# Patient Record
Sex: Male | Born: 1970 | Race: White | Hispanic: No | Marital: Married | Smoking: Current every day smoker
Health system: Southern US, Community
[De-identification: ages and names within clinical notes are randomized; demographics above are authoritative.]

## PROBLEM LIST (undated history)

## (undated) DIAGNOSIS — F17200 Nicotine dependence, unspecified, uncomplicated: Secondary | ICD-10-CM

## (undated) DIAGNOSIS — F1021 Alcohol dependence, in remission: Secondary | ICD-10-CM

## (undated) DIAGNOSIS — E785 Hyperlipidemia, unspecified: Secondary | ICD-10-CM

## (undated) DIAGNOSIS — F32A Depression, unspecified: Secondary | ICD-10-CM

## (undated) DIAGNOSIS — E669 Obesity, unspecified: Secondary | ICD-10-CM

## (undated) DIAGNOSIS — E66811 Obesity, class 1: Secondary | ICD-10-CM

## (undated) DIAGNOSIS — I10 Essential (primary) hypertension: Secondary | ICD-10-CM

## (undated) DIAGNOSIS — F419 Anxiety disorder, unspecified: Secondary | ICD-10-CM

## (undated) DIAGNOSIS — F329 Major depressive disorder, single episode, unspecified: Secondary | ICD-10-CM

## (undated) HISTORY — PX: APPENDECTOMY: SHX54

## (undated) HISTORY — PX: BACK SURGERY: SHX140

## (undated) HISTORY — PX: ELBOW SURGERY: SHX618

---

## 2008-10-04 ENCOUNTER — Emergency Department: Payer: Self-pay | Admitting: Emergency Medicine

## 2010-10-18 ENCOUNTER — Inpatient Hospital Stay: Payer: Self-pay | Admitting: Psychiatry

## 2012-01-25 ENCOUNTER — Ambulatory Visit: Payer: Self-pay | Admitting: Unknown Physician Specialty

## 2012-01-27 LAB — PATHOLOGY REPORT

## 2014-08-14 NOTE — Op Note (Signed)
PATIENT NAME:  Tony Gibson, Tony Gibson MR#:  [id number] DATE OF BIRTH:  [birth year]  DATE OF PROCEDURE:  01/25/2012  PREOPERATIVE DIAGNOSES:  1. Chronic lateral epicondylitis, left elbow.  2. Ulnar nerve entrapment left elbow.   POSTOPERATIVE DIAGNOSES:  1. Chronic lateral epicondylitis, left elbow.  2. Ulnar nerve entrapment left elbow.   PROCEDURES PERFORMED: Nirschl procedure of left elbow along with anterior transposition of the left ulnar nerve.   SURGEON: Kathrene Alu., M.D.   ANESTHESIA: General.   HISTORY: The patient had a long history of lateral epicondylitis to his left elbow. It has been refractory to conservative treatment which included injection of the lateral epicondyle with steroid and anesthetic. He also had ulnar nerve entrapment at the elbow that was confirmed with nerve conduction studies.   The patient was ultimately brought in for surgery due to the chronicity of his condition.   DESCRIPTION OF PROCEDURE: The patient was taken to the Operating Room where satisfactory general anesthesia was achieved. A tourniquet was applied to his left upper arm. The left upper extremity was prepped and draped in the usual fashion for a procedure about the elbow. The left upper extremity was exsanguinated and the tourniquet was inflated. About a 1-1/2 inch incision was made over the patient's lateral epicondyle. Dissection was carried down through the subcutaneous tissue onto the extensor mechanism. The interval between the extensor carpi radialis and the common extensor tendon was divided. Dissection was carried down to the lateral epicondyle and the radial head. Fibrous scar tissue was appreciated and was resected in a wedge-shape fashion. I did open the radial humeral joint. There was some fluid in the joint but no articular surface deformity was appreciated.   I went ahead and used a rongeur to debride some osteophytes from the lateral epicondyle. I then drilled multiple small  holes in the lateral condyle.   The split that I made in the extensor mechanism was then closed side-to-side with #0 Vicryl sutures. Next, I went ahead and approached the medial side of the elbow. A slightly curved incision was made just anterior to the medial epicondyle and dissection was bluntly and sharply carried down to the medial epicondyle itself. I then freed up the ulnar nerve from its olecranon groove. Care was taken to protect the muscular branches of the ulnar nerve. I used a nerve stimulator to identify these branches.   The nerve was mobilized so that it could be transposed anteriorly to the soft tissue over the medial epicondyle. I held the nerve in this position with a suture placed through the subcutaneous tissue of the skin and then into the medial epicondyle just posterior to the anteriorly transposed nerve. I used two sutures to create the new tunnel for the ulnar nerve. The ulnar nerve seemed to move freely in the tunnel.   At this time, the tourniquet was released. Bleeding was controlled with digital pressure and coagulation cautery. The wound was irrigated with GU irrigant. The subcutaneous was closed with 2-0 Vicryl and the skin with skin staples. I then irrigated the lateral incision with GU irrigant and closed the subcutaneous with 2-0 Vicryl and the skin with skin staples. I infiltrated the medial wound with about 10 mL of 0.5% Marcaine without epinephrine and the lateral wound was infiltrated with about 5 mL of 0.5% Marcaine without epinephrine. Betadine was applied to the wounds followed by a sterile dressing. A posterior fiberglass splint was applied with the elbow flexed about 90 degrees. The wrist was included.  The patient was then awakened and transferred to a stretcher bed. He was taken to the recovery room in satisfactory condition. The tourniquet incidentally was released at the conclusion of the procedure. It was up 68 minutes. Blood loss was negligible.   ____________________________ Kathrene Alu., MD hbk:slb D: 01/25/2012 13:25:51 ET    T: 01/25/2012 13:46:15 ET        JOB#: [id number] cc: Kathrene Alu., MD, <Dictator> Vilinda Flake, Brooke Bonito MD ELECTRONICALLY SIGNED 03/03/2012 11:32

## 2015-10-15 ENCOUNTER — Ambulatory Visit: Payer: Self-pay | Admitting: Urology

## 2017-02-15 ENCOUNTER — Emergency Department
Admission: EM | Admit: 2017-02-15 | Discharge: 2017-02-15 | Disposition: A | Discharge status: Home / Self Care | Payer: 59 | Attending: Emergency Medicine | Admitting: Emergency Medicine

## 2017-02-15 ENCOUNTER — Emergency Department: Payer: 59

## 2017-02-15 ENCOUNTER — Encounter: Payer: Self-pay | Admitting: Emergency Medicine

## 2017-02-15 DIAGNOSIS — R079 Chest pain, unspecified: Secondary | ICD-10-CM

## 2017-02-15 DIAGNOSIS — F17210 Nicotine dependence, cigarettes, uncomplicated: Secondary | ICD-10-CM | POA: Diagnosis not present

## 2017-02-15 DIAGNOSIS — I10 Essential (primary) hypertension: Secondary | ICD-10-CM | POA: Diagnosis not present

## 2017-02-15 DIAGNOSIS — R0789 Other chest pain: Secondary | ICD-10-CM | POA: Insufficient documentation

## 2017-02-15 HISTORY — DX: Essential (primary) hypertension: I10

## 2017-02-15 LAB — CBC
HEMATOCRIT: 45.5 % (ref 40.0–52.0)
HEMOGLOBIN: 15.5 g/dL (ref 13.0–18.0)
MCH: 31.1 pg (ref 26.0–34.0)
MCHC: 34 g/dL (ref 32.0–36.0)
MCV: 91.4 fL (ref 80.0–100.0)
PLATELETS: 261 10*3/uL (ref 150–440)
RBC: 4.98 MIL/uL (ref 4.40–5.90)
RDW: 12.9 % (ref 11.5–14.5)
WBC: 7 10*3/uL (ref 3.8–10.6)

## 2017-02-15 LAB — BASIC METABOLIC PANEL WITH GFR
Anion gap: 7 (ref 5–15)
BUN: 14 mg/dL (ref 6–20)
CO2: 25 mmol/L (ref 22–32)
Calcium: 8.8 mg/dL — ABNORMAL LOW (ref 8.9–10.3)
Chloride: 101 mmol/L (ref 101–111)
Creatinine, Ser: 1.03 mg/dL (ref 0.61–1.24)
GFR calc Af Amer: >60 mL/min
GFR calc non Af Amer: >60 mL/min
Glucose, Bld: 185 mg/dL — ABNORMAL HIGH (ref 65–99)
Potassium: 3.5 mmol/L (ref 3.5–5.1)
Sodium: 133 mmol/L — ABNORMAL LOW (ref 135–145)

## 2017-02-15 LAB — LIPASE, BLOOD: Lipase: 37 U/L (ref 11–51)

## 2017-02-15 LAB — TROPONIN I: Troponin I: <0.03 ng/mL

## 2017-02-15 MED ORDER — GI COCKTAIL ~~LOC~~
30.0000 mL | Freq: Once | ORAL | Status: AC
  Administered 2017-02-15: 30 mL via ORAL
  Filled 2017-02-15: qty 30

## 2017-02-15 NOTE — ED Provider Notes (Signed)
[facility] Emergency Department Provider Note  Time seen: 8:20 AM  I have reviewed the triage vital signs and the nursing notes.   HISTORY  Chief Complaint Chest Pain    HPI Tony Gibson is a 46 y.o. male with a past medical history of hypertension presents to the emergency department for chest pain.  According to the patient for the past 24 hours he has been experiencing a sharp pain in the center of his chest with occasional pains in his left shoulder.  Denies any nausea, shortness of breath or diaphoresis.  States the pain has been constant over the past 24 hours, denies any associated symptoms.  Denies any worsening with deep inspiration, cough or movement.  Patient denies any personal or family cardiac history.  Denies any abdominal pain.   Past Medical History:  Diagnosis Date  . Hypertension     There are no active problems to display for this patient.   Past Surgical History:  Procedure Laterality Date  . APPENDECTOMY    . ELBOW SURGERY      Prior to Admission medications   Not on File    Allergies  Allergen Reactions  . Zithromax [Azithromycin] Swelling    No family history on file.  Social History Social History  Substance Use Topics  . Smoking status: Current Every Day Smoker  . Smokeless tobacco: Never Used  . Alcohol use Yes    Review of Systems  Constitutional: Negative for fever. Cardiovascular: Positive for chest pain over the past 24 hours.  Mild currently. Respiratory: Negative for shortness of breath. Gastrointestinal: Negative for abdominal pain Musculoskeletal: Negative for back pain. Neurological: Negative for headache All other ROS negative  ____________________________________________   PHYSICAL EXAM:  VITAL SIGNS: ED Triage Vitals  Enc Vitals Group     BP 02/15/17 0711 (!) 173/80     Pulse Rate 02/15/17 0711 97     Resp 02/15/17 0711 18     Temp 02/15/17 0711 97.7 F (36.5 C)     Temp Source  02/15/17 0711 Oral     SpO2 02/15/17 0711 96 %     Weight 02/15/17 0714 263 lb (119.3 kg)     Height 02/15/17 0714 6\' 1"  (1.854 m)     Head Circumference --      Peak Flow --      Pain Score 02/15/17 0713 3     Pain Loc --      Pain Edu? --      Excl. in [place]? --     Constitutional: Alert and oriented. Well appearing and in no distress. Eyes: Normal exam ENT   Head: Normocephalic and atraumatic.   Mouth/Throat: Mucous membranes are moist. Cardiovascular: Normal rate, regular rhythm. No murmur.  No chest wall tenderness to palpation.   Respiratory: Normal respiratory effort without tachypnea nor retractions. Breath sounds are clear Gastrointestinal: Soft, mild epigastric tenderness to palpation, no rebound or guarding.  No distention. Musculoskeletal: Nontender with normal range of motion in all extremities. No lower extremity tenderness or edema. Neurologic:  Normal speech and language. No gross focal neurologic deficits Skin:  Skin is warm, dry and intact.  Psychiatric: Mood and affect are normal.  ____________________________________________    EKG  EKG reviewed and interpreted by myself shows normal sinus rhythm at 100 bpm, narrow QRS, normal axis, normal intervals, no concerning ST changes.  Overall normal EKG.  ____________________________________________    RADIOLOGY  Chest x-ray normal  ____________________________________________   INITIAL IMPRESSION /  ASSESSMENT AND PLAN / ED COURSE  Pertinent labs & imaging results that were available during my care of the patient were reviewed by me and considered in my medical decision making (see chart for details).  Patient presents to the emergency department for central chest pain with some pain in his left shoulder.  Describes the pain as sharp, mild currently.  Differential would include ACS, chest wall pain, muscular skeletal pain, pleurisy.  Patient's labs including troponin are normal/negative.  X-ray is normal.   EKG is reassuring.  Patient does admit to daily alcohol intake, on exam he has mild to moderate epigastric tenderness to palpation.  We will treat with a GI cocktail and add on a lipase.  Patient agreeable to plan.  Labs are normal.  Troponin negative.  Lipase is normal.  Patient states pain is improved after GI cocktail states only very minimal discomfort at this time.  As the pain has been ongoing greater than 24 hours the troponin is negative, and the patient had improvement with GI cocktail, I believe the patient is safe for discharge home.  I did discuss with the patient follow-up with cardiology for stress test.  ____________________________________________   FINAL CLINICAL IMPRESSION(S) / ED DIAGNOSES  Chest pain    Harvest Dark, MD 02/15/17 [phone]

## 2017-02-15 NOTE — Discharge Instructions (Signed)
You have been seen in the emergency department today for chest pain. Your workup has shown normal results. As we discussed please follow-up with your primary care physician in the next 1-2 days for recheck. Return to the emergency department for any further chest pain, trouble breathing, or any other symptom personally concerning to yourself.  Please call the number provided for cardiology to arrange a stress test as soon as possible.

## 2017-02-15 NOTE — ED Triage Notes (Signed)
Says sharp chest pain for 24  Hours andnow going down left arm.

## 2017-02-15 NOTE — ED Notes (Signed)
Epad not working at time of discharge; paper copy signed and placed in chart.   

## 2017-07-13 ENCOUNTER — Other Ambulatory Visit: Payer: Self-pay | Admitting: Family Medicine

## 2017-07-13 DIAGNOSIS — M5442 Lumbago with sciatica, left side: Principal | ICD-10-CM

## 2017-07-13 DIAGNOSIS — G8929 Other chronic pain: Secondary | ICD-10-CM

## 2017-07-21 ENCOUNTER — Ambulatory Visit: Payer: 59

## 2017-08-16 ENCOUNTER — Ambulatory Visit
Admission: RE | Admit: 2017-08-16 | Discharge: 2017-08-16 | Disposition: A | Discharge status: Home / Self Care | Payer: Managed Care, Other (non HMO) | Source: Ambulatory Visit | Attending: Internal Medicine | Admitting: Internal Medicine

## 2017-08-16 ENCOUNTER — Other Ambulatory Visit: Payer: Self-pay | Admitting: Internal Medicine

## 2017-08-16 ENCOUNTER — Ambulatory Visit
Admission: RE | Admit: 2017-08-16 | Discharge: 2017-08-16 | Disposition: A | Discharge status: Home / Self Care | Payer: Managed Care, Other (non HMO) | Source: Ambulatory Visit | Attending: Family Medicine | Admitting: Family Medicine

## 2017-08-16 DIAGNOSIS — M5127 Other intervertebral disc displacement, lumbosacral region: Secondary | ICD-10-CM | POA: Insufficient documentation

## 2017-08-16 DIAGNOSIS — M5126 Other intervertebral disc displacement, lumbar region: Secondary | ICD-10-CM | POA: Diagnosis not present

## 2017-08-16 DIAGNOSIS — Z5309 Procedure and treatment not carried out because of other contraindication: Secondary | ICD-10-CM

## 2017-08-16 DIAGNOSIS — M5442 Lumbago with sciatica, left side: Secondary | ICD-10-CM | POA: Diagnosis not present

## 2017-08-16 DIAGNOSIS — G8929 Other chronic pain: Secondary | ICD-10-CM | POA: Insufficient documentation

## 2017-08-16 DIAGNOSIS — M48061 Spinal stenosis, lumbar region without neurogenic claudication: Secondary | ICD-10-CM | POA: Insufficient documentation

## 2017-08-16 DIAGNOSIS — Z135 Encounter for screening for eye and ear disorders: Secondary | ICD-10-CM | POA: Insufficient documentation

## 2018-06-15 ENCOUNTER — Encounter: Payer: Self-pay | Admitting: Emergency Medicine

## 2018-06-15 ENCOUNTER — Emergency Department
Admission: EM | Admit: 2018-06-15 | Discharge: 2018-06-15 | Disposition: A | Discharge status: Home / Self Care | Payer: Managed Care, Other (non HMO) | Attending: Emergency Medicine | Admitting: Emergency Medicine

## 2018-06-15 ENCOUNTER — Emergency Department: Payer: Managed Care, Other (non HMO)

## 2018-06-15 DIAGNOSIS — F17210 Nicotine dependence, cigarettes, uncomplicated: Secondary | ICD-10-CM | POA: Insufficient documentation

## 2018-06-15 DIAGNOSIS — Z79899 Other long term (current) drug therapy: Secondary | ICD-10-CM | POA: Diagnosis not present

## 2018-06-15 DIAGNOSIS — Z7982 Long term (current) use of aspirin: Secondary | ICD-10-CM | POA: Insufficient documentation

## 2018-06-15 DIAGNOSIS — M5416 Radiculopathy, lumbar region: Secondary | ICD-10-CM | POA: Diagnosis present

## 2018-06-15 DIAGNOSIS — G8929 Other chronic pain: Secondary | ICD-10-CM | POA: Diagnosis not present

## 2018-06-15 DIAGNOSIS — I10 Essential (primary) hypertension: Secondary | ICD-10-CM | POA: Diagnosis not present

## 2018-06-15 MED ORDER — METHYLPREDNISOLONE SODIUM SUCC 125 MG IJ SOLR
125.0000 mg | Freq: Once | INTRAMUSCULAR | Status: AC
  Administered 2018-06-15: 125 mg via INTRAMUSCULAR
  Filled 2018-06-15: qty 2

## 2018-06-15 MED ORDER — LIDOCAINE 5 % EX PTCH: 1.0000 | MEDICATED_PATCH | Freq: Two times a day (BID) | CUTANEOUS | 0 refills | Status: AC

## 2018-06-15 MED ORDER — OXYCODONE-ACETAMINOPHEN 5-325 MG PO TABS
1.0000 | ORAL_TABLET | Freq: Once | ORAL | Status: AC
  Administered 2018-06-15: 1 via ORAL
  Filled 2018-06-15: qty 1

## 2018-06-15 MED ORDER — LIDOCAINE 5 % EX PTCH
1.0000 | MEDICATED_PATCH | CUTANEOUS | Status: DC
  Administered 2018-06-15: 1 via TRANSDERMAL
  Filled 2018-06-15: qty 1

## 2018-06-15 MED ORDER — OXYCODONE-ACETAMINOPHEN 7.5-325 MG PO TABS: 1.0000 | ORAL_TABLET | Freq: Four times a day (QID) | ORAL | 0 refills | Status: DC | PRN

## 2018-06-15 MED ORDER — HYDROMORPHONE HCL 1 MG/ML IJ SOLN
1.0000 mg | Freq: Once | INTRAMUSCULAR | Status: AC
  Administered 2018-06-15: 1 mg via INTRAMUSCULAR
  Filled 2018-06-15: qty 1

## 2018-06-15 NOTE — ED Triage Notes (Signed)
Pt reports hx of bulging disc in his back and Sunday he jumped out of the way of something and thinks he pulled something. Pt reports pain is more no left lower back and radiates down his left leg.

## 2018-06-15 NOTE — ED Provider Notes (Signed)
[facility] Emergency Department Provider Note   ____________________________________________   First MD Initiated Contact with Patient 06/15/18 [phone]     (approximate)  I have reviewed the triage vital signs and the nursing notes.   HISTORY  Chief Complaint Back Pain    HPI Tony Gibson is a 48 y.o. male patient presents with a history of bulging disks.  Patient state he was at work 4 days ago and jumped all the way of a foreign object.  Patient states since that incident he has had increasing radicular pain to the left lower extremity.  Patient became concerned today secondary to incontinence.  Patient state pain increased with standing and ambulation.  Patient rates pain as 10/10.  Patient described pain is "sharp".  Patient denies numbness.  Past Medical History:  Diagnosis Date  . Hypertension     There are no active problems to display for this patient.   Past Surgical History:  Procedure Laterality Date  . APPENDECTOMY    . ELBOW SURGERY      Prior to Admission medications   Medication Sig Start Date End Date Taking? Authorizing Provider  albuterol (VENTOLIN HFA) 108 (90 Base) MCG/ACT inhaler Inhale 2 puffs into the lungs every 4 (four) hours as needed for wheezing. 05/21/16   [provider]  aspirin EC 81 MG tablet Take 81 mg by mouth daily.    [provider]  buPROPion (WELLBUTRIN SR) 150 MG 12 hr tablet Take 150 mg by mouth 2 (two) times daily. 12/03/16   [provider]  hydrochlorothiazide (HYDRODIURIL) 25 MG tablet Take 25 mg by mouth daily. 12/11/16   [provider]  lidocaine (LIDODERM) 5 % Place 1 patch onto the skin every 12 (twelve) hours. Remove & Discard patch within 12 hours or as directed by MD 06/15/18 06/15/19  Sable Feil, PA-C  losartan (COZAAR) 100 MG tablet Take 100 mg by mouth daily. 12/29/16   [provider]  lovastatin (MEVACOR) 10 MG tablet Take 10 mg by mouth at  bedtime. 01/08/17   [provider]  oxyCODONE-acetaminophen (PERCOCET) 7.5-325 MG tablet Take 1 tablet by mouth every 6 (six) hours as needed. 06/15/18   Sable Feil, PA-C  PARoxetine (PAXIL) 40 MG tablet Take 20 mg by mouth at bedtime. 12/29/16   [provider]    Allergies Zithromax [azithromycin]  No family history on file.  Social History Social History   Tobacco Use  . Smoking status: Current Every Day Smoker  . Smokeless tobacco: Never Used  Substance Use Topics  . Alcohol use: Yes  . Drug use: Not on file    Review of Systems Constitutional: No fever/chills Eyes: No visual changes. ENT: No sore throat. Cardiovascular: Denies chest pain. Respiratory: Denies shortness of breath. Gastrointestinal: No abdominal pain.  No nausea, no vomiting.  No diarrhea.  No constipation. Genitourinary: One episode of incontinence this morning. Musculoskeletal: Chronic back pain. Skin: Negative for rash. Neurological: Negative for headaches, focal weakness or numbness. Endocrine:  Hypertension. Allergic/Immunilogical: Zithromax. ____________________________________________   PHYSICAL EXAM:  VITAL SIGNS: ED Triage Vitals  Enc Vitals Group     BP 06/15/18 0917 (!) 153/95     Pulse Rate 06/15/18 0917 (!) 116     Resp 06/15/18 0917 16     Temp 06/15/18 0917 98.3 F (36.8 C)     Temp Source 06/15/18 0917 Oral     SpO2 06/15/18 0917 96 %     Weight 06/15/18 0915 270  lb (122.5 kg)     Height 06/15/18 0915 6\' 1"  (1.854 m)     Head Circumference --      Peak Flow --      Pain Score 06/15/18 0915 10     Pain Loc --      Pain Edu? --      Excl. in [place]? --     Constitutional: Alert and oriented.  Moderate distress.   Hematological/Lymphatic/Immunilogical: No cervical lymphadenopathy. Cardiovascular: Normal rate, regular rhythm. Grossly normal heart sounds.  Good peripheral circulation.  Elevated blood pressure. Respiratory: Normal respiratory effort.  No  retractions. Lungs CTAB. Musculoskeletal: No obvious deformity.  Patient states this time her reliance on upper extremity.  Patient is moderate guarding palpation of L3-S1.  In the supine position patient has positive straight leg test.   Neurologic:  Normal speech and language. No gross focal neurologic deficits are appreciated. No gait instability. Skin:  Skin is warm, dry and intact. No rash noted. Psychiatric: Mood and affect are normal. Speech and behavior are normal.  ____________________________________________   LABS (all labs ordered are listed, but only abnormal results are displayed)  Labs Reviewed - No data to display ____________________________________________  EKG   ____________________________________________  RADIOLOGY  ED MD interpretation:    Official radiology report(s): Mr Lumbar Spine Wo Contrast  Result Date: 06/15/2018 CLINICAL DATA:  Recent injury with worsening of left back pain and leg pain. EXAM: MRI LUMBAR SPINE WITHOUT CONTRAST TECHNIQUE: Multiplanar, multisequence MR imaging of the lumbar spine was performed. No intravenous contrast was administered. COMPARISON:  08/16/2017 FINDINGS: Segmentation: 5 lumbar type vertebral bodies as numbered previously. Alignment:  Normal Vertebrae:  No fracture or primary bone lesion. Conus medullaris and cauda equina: Conus extends to the L1-2 level. Conus and cauda equina appear normal. Paraspinal and other soft tissues: Negative Disc levels: No abnormality at T12-L1, L1-2 or L2-3. L3-4: Central disc herniation indents the thecal sac but does not cause neural compression. Similar appearance to the previous study. L4-5: Broad-based disc herniation more prominent towards the left. This compresses the thecal sac and results in lateral recess stenosis left more than right. This is similar to the previous study, perhaps minimally larger. 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 L5-S1: Disc degeneration with  loss of disc height. Endplate osteophytes and broad-based disc herniation more prominent towards the left. Stenosis of the subarticular lateral recesses left more than right. Bilateral foraminal narrowing. Potential for neural compression at this level, particularly in the left lateral recess. IMPRESSION: Very similar appearance to the study of April 2019. If there is any change, there may be very slight increased prominence of broad-based left posterolateral predominant disc herniation at L4-5. This is compressing the thecal sac and resulting in bilateral lateral recess stenosis that could compress either or both L5 nerves. The stenosis is worse on the left. L3-4: Central disc protrusion indents the thecal sac but does not cause visible neural compression. No change. L5-S1: Broad-based left posterolateral predominant osteophytes and disc herniation with stenosis of the subarticular lateral recesses left more than right that could compress in particular the left S1 nerve. Bilateral foraminal narrowing as well. No visible change at this level. Electronically Signed   By: Nelson Chimes M.D.   On: 06/15/2018 12:03    ____________________________________________   PROCEDURES  Procedure(s) performed: None  Procedures  Critical Care performed: No  ____________________________________________   INITIAL IMPRESSION / ASSESSMENT AND PLAN / ED COURSE  As part of my medical  decision making, I reviewed the following data within the electronic MEDICAL RECORD NUMBER     Radicular back pain secondary to disc protrusion.  Discussed MRI findings with patient.  Advised patient if you have any more episodes of incontinence return back to the ED.  Patient voided prior to departure.  Patient given discharge care instruction advised take medication as directed.  Patient advised follow-up with neurosurgery.      ____________________________________________   FINAL CLINICAL IMPRESSION(S) / ED DIAGNOSES  Final  diagnoses:  Chronic radicular low back pain     ED Discharge Orders         Ordered    lidocaine (LIDODERM) 5 %  Every 12 hours     06/15/18 1253    oxyCODONE-acetaminophen (PERCOCET) 7.5-325 MG tablet  Every 6 hours PRN     06/15/18 1253           Note:  This document was prepared using Dragon voice recognition software and may include unintentional dictation errors.    Sable Feil, PA-C 06/15/18 1256    Earleen Newport, MD 06/15/18 [phone]

## 2018-06-24 ENCOUNTER — Other Ambulatory Visit: Payer: Self-pay

## 2018-06-24 ENCOUNTER — Encounter
Admission: RE | Admit: 2018-06-24 | Discharge: 2018-06-24 | Disposition: A | Discharge status: Home / Self Care | Payer: Managed Care, Other (non HMO) | Source: Ambulatory Visit | Attending: Neurosurgery | Admitting: Neurosurgery

## 2018-06-24 ENCOUNTER — Ambulatory Visit
Admission: RE | Admit: 2018-06-24 | Discharge: 2018-06-24 | Disposition: A | Discharge status: Home / Self Care | Payer: Managed Care, Other (non HMO) | Source: Ambulatory Visit | Attending: Neurosurgery | Admitting: Neurosurgery

## 2018-06-24 DIAGNOSIS — Z01818 Encounter for other preprocedural examination: Secondary | ICD-10-CM | POA: Insufficient documentation

## 2018-06-24 HISTORY — DX: Anxiety disorder, unspecified: F41.9

## 2018-06-24 HISTORY — DX: Major depressive disorder, single episode, unspecified: F32.9

## 2018-06-24 HISTORY — DX: Depression, unspecified: F32.A

## 2018-06-24 HISTORY — DX: Hyperlipidemia, unspecified: E78.5

## 2018-06-24 LAB — URINALYSIS, ROUTINE W REFLEX MICROSCOPIC
Bilirubin Urine: NEGATIVE
Glucose, UA: NEGATIVE mg/dL
HGB URINE DIPSTICK: NEGATIVE
Ketones, ur: NEGATIVE mg/dL
Leukocytes,Ua: NEGATIVE
Nitrite: NEGATIVE
PH: 5 (ref 5.0–8.0)
Protein, ur: NEGATIVE mg/dL
Specific Gravity, Urine: 1.028 (ref 1.005–1.030)
Squamous Epithelial / HPF: NONE SEEN (ref 0–5)
WBC, UA: NONE SEEN WBC/hpf (ref 0–5)

## 2018-06-24 LAB — CBC
HEMATOCRIT: 48.3 % (ref 39.0–52.0)
Hemoglobin: 16.4 g/dL (ref 13.0–17.0)
MCH: 30.7 pg (ref 26.0–34.0)
MCHC: 34 g/dL (ref 30.0–36.0)
MCV: 90.4 fL (ref 80.0–100.0)
Platelets: 345 10*3/uL (ref 150–400)
RBC: 5.34 MIL/uL (ref 4.22–5.81)
RDW: 12.9 % (ref 11.5–15.5)
WBC: 15.4 10*3/uL — ABNORMAL HIGH (ref 4.0–10.5)
nRBC: 0 % (ref 0.0–0.2)

## 2018-06-24 LAB — PROTIME-INR
INR: 0.9 (ref 0.8–1.2)
Prothrombin Time: 12.1 seconds (ref 11.4–15.2)

## 2018-06-24 LAB — BASIC METABOLIC PANEL
Anion gap: 10 (ref 5–15)
BUN: 17 mg/dL (ref 6–20)
CHLORIDE: 97 mmol/L — AB (ref 98–111)
CO2: 28 mmol/L (ref 22–32)
Calcium: 9.3 mg/dL (ref 8.9–10.3)
Creatinine, Ser: 0.72 mg/dL (ref 0.61–1.24)
GFR calc Af Amer: >60 mL/min (ref >60)
GFR calc non Af Amer: >60 mL/min (ref >60)
Glucose, Bld: 122 mg/dL — ABNORMAL HIGH (ref 70–99)
Potassium: 3.8 mmol/L (ref 3.5–5.1)
Sodium: 135 mmol/L (ref 135–145)

## 2018-06-24 LAB — DIFFERENTIAL
Abs Immature Granulocytes: 0.12 10*3/uL — ABNORMAL HIGH (ref 0.00–0.07)
Basophils Absolute: 0.2 10*3/uL — ABNORMAL HIGH (ref 0.0–0.1)
Basophils Relative: 1 %
Eosinophils Absolute: 0.1 10*3/uL (ref 0.0–0.5)
Eosinophils Relative: 1 %
Immature Granulocytes: 1 %
LYMPHS ABS: 3.6 10*3/uL (ref 0.7–4.0)
LYMPHS PCT: 24 %
MONOS PCT: 10 %
Monocytes Absolute: 1.5 10*3/uL — ABNORMAL HIGH (ref 0.1–1.0)
Neutro Abs: 10 10*3/uL — ABNORMAL HIGH (ref 1.7–7.7)
Neutrophils Relative %: 63 %

## 2018-06-24 LAB — SURGICAL PCR SCREEN
MRSA, PCR: NEGATIVE
Staphylococcus aureus: NEGATIVE

## 2018-06-24 LAB — APTT: aPTT: 26 seconds (ref 24–36)

## 2018-06-24 NOTE — Patient Instructions (Signed)
Your procedure is scheduled on: Monday 06/27/18 at 6 am Report to [place]. [phone] if you have any concerns thath morning.  Remember: Instructions that are not followed completely may result in serious medical risk, up to and including death, or upon the discretion of your surgeon and anesthesiologist your surgery may need to be rescheduled.     _X__ 1. Do not eat food after midnight the night before your procedure.                 No gum chewing or hard candies. You may drink clear liquids up to 2 hours                 before you are scheduled to arrive for your surgery- DO not drink clear                 liquids within 2 hours of the start of your surgery.                 Clear Liquids include:  water, apple juice without pulp, clear carbohydrate                 drink such as Clearfast or Gatorade, Black Coffee or Tea (Do not add                 anything to coffee or tea).  __X__2.  On the morning of surgery brush your teeth with toothpaste and water, you                 may rinse your mouth with mouthwash if you wish.  Do not swallow any              toothpaste of mouthwash.     _X__ 3.  No Alcohol for 24 hours before or after surgery.   _X__ 4.  Do Not Smoke or use e-cigarettes For 24 Hours Prior to Your Surgery.                 Do not use any chewable tobacco products for at least 6 hours prior to                 surgery.  ____  5.  Bring all medications with you on the day of surgery if instructed.   __X__  6.  Notify your doctor if there is any change in your medical condition      (cold, fever, infections).     Do not wear jewelry, make-up, hairpins, clips or nail polish. Do not wear lotions, powders, or perfumes.  Do not shave 48 hours prior to surgery. Men may shave face and neck. Do not bring valuables to the hospital.    [facility] is not responsible for any belongings or valuables.  Contacts,  dentures/partials or body piercings may not be worn into surgery. Bring a case for your contacts, glasses or hearing aids, a denture cup will be supplied. Leave your suitcase in the car. After surgery it may be brought to your room. For patients admitted to the hospital, discharge time is determined by your treatment team.   Patients discharged the day of surgery will not be allowed to drive home.   Please read over the following fact sheets that you were given:   MRSA Information  __X__ Take these medicines the morning of surgery with A SIP OF WATER:    1. amLODipine (NORVASC)  2. busPIRone (  BUSPAR)  3.   4.  5.  6.  ____ Fleet Enema (as directed)   __X__ Use CHG Soap/SAGE wipes as directed  __X__ Use inhalers on the day of surgery  ____ Stop metformin/Janumet/Farxiga 2 days prior to surgery    ____ Take 1/2 of usual insulin dose the night before surgery. No insulin the morning          of surgery.   ____ Stop Blood Thinners Coumadin/Plavix/Xarelto/Pleta/Pradaxa/Eliquis/Effient/Aspirin  on   Or contact your Surgeon, Cardiologist or Medical Doctor regarding  ability to stop your blood thinners  __X__ Stop Anti-inflammatories 7 days before surgery such as Advil, Ibuprofen, Motrin,  BC or Goodies Powder, Naprosyn, Naproxen, Aleve, Aspirin    __X__ Stop all herbal supplements, fish oil or vitamin E until after surgery.    ____ Bring C-Pap to the hospital.

## 2018-06-27 ENCOUNTER — Other Ambulatory Visit: Payer: Self-pay

## 2018-06-27 ENCOUNTER — Ambulatory Visit: Payer: Managed Care, Other (non HMO) | Admitting: Certified Registered"

## 2018-06-27 ENCOUNTER — Ambulatory Visit: Payer: Managed Care, Other (non HMO)

## 2018-06-27 ENCOUNTER — Encounter
Admission: RE | Disposition: A | Discharge status: Home / Self Care | Payer: Self-pay | Source: Home / Self Care | Attending: Neurosurgery

## 2018-06-27 ENCOUNTER — Ambulatory Visit
Admission: RE | Admit: 2018-06-27 | Discharge: 2018-06-27 | Disposition: A | Discharge status: Home / Self Care | Payer: Managed Care, Other (non HMO) | Attending: Neurosurgery | Admitting: Neurosurgery

## 2018-06-27 ENCOUNTER — Encounter: Payer: Self-pay | Admitting: Neurosurgery

## 2018-06-27 DIAGNOSIS — Z881 Allergy status to other antibiotic agents status: Secondary | ICD-10-CM | POA: Diagnosis not present

## 2018-06-27 DIAGNOSIS — Z419 Encounter for procedure for purposes other than remedying health state, unspecified: Secondary | ICD-10-CM

## 2018-06-27 DIAGNOSIS — M48061 Spinal stenosis, lumbar region without neurogenic claudication: Secondary | ICD-10-CM | POA: Diagnosis not present

## 2018-06-27 DIAGNOSIS — F419 Anxiety disorder, unspecified: Secondary | ICD-10-CM | POA: Diagnosis not present

## 2018-06-27 DIAGNOSIS — Z79899 Other long term (current) drug therapy: Secondary | ICD-10-CM | POA: Insufficient documentation

## 2018-06-27 DIAGNOSIS — F17210 Nicotine dependence, cigarettes, uncomplicated: Secondary | ICD-10-CM | POA: Diagnosis not present

## 2018-06-27 DIAGNOSIS — I10 Essential (primary) hypertension: Secondary | ICD-10-CM | POA: Insufficient documentation

## 2018-06-27 DIAGNOSIS — Z7982 Long term (current) use of aspirin: Secondary | ICD-10-CM | POA: Diagnosis not present

## 2018-06-27 DIAGNOSIS — F329 Major depressive disorder, single episode, unspecified: Secondary | ICD-10-CM | POA: Insufficient documentation

## 2018-06-27 DIAGNOSIS — E669 Obesity, unspecified: Secondary | ICD-10-CM | POA: Diagnosis not present

## 2018-06-27 DIAGNOSIS — E785 Hyperlipidemia, unspecified: Secondary | ICD-10-CM | POA: Insufficient documentation

## 2018-06-27 DIAGNOSIS — M5116 Intervertebral disc disorders with radiculopathy, lumbar region: Secondary | ICD-10-CM | POA: Insufficient documentation

## 2018-06-27 HISTORY — PX: LUMBAR LAMINECTOMY/DECOMPRESSION MICRODISCECTOMY: SHX5026

## 2018-06-27 SURGERY — LUMBAR LAMINECTOMY/DECOMPRESSION MICRODISCECTOMY 2 LEVELS
Anesthesia: General | Laterality: Left

## 2018-06-27 MED ORDER — PROPOFOL 10 MG/ML IV BOLUS
INTRAVENOUS | Status: AC
  Filled 2018-06-27: qty 20

## 2018-06-27 MED ORDER — EPINEPHRINE PF 1 MG/ML IJ SOLN
INTRAMUSCULAR | Status: AC
  Filled 2018-06-27: qty 1

## 2018-06-27 MED ORDER — EPHEDRINE SULFATE 50 MG/ML IJ SOLN
INTRAMUSCULAR | Status: AC
  Filled 2018-06-27: qty 1

## 2018-06-27 MED ORDER — PHENYLEPHRINE HCL 10 MG/ML IJ SOLN
INTRAMUSCULAR | Status: AC
  Filled 2018-06-27: qty 1

## 2018-06-27 MED ORDER — LIDOCAINE HCL (PF) 2 % IJ SOLN
INTRAMUSCULAR | Status: AC
  Filled 2018-06-27: qty 10

## 2018-06-27 MED ORDER — FAMOTIDINE 20 MG PO TABS
ORAL_TABLET | ORAL | Status: AC
  Filled 2018-06-27: qty 1

## 2018-06-27 MED ORDER — ACETAMINOPHEN 10 MG/ML IV SOLN
INTRAVENOUS | Status: DC | PRN
  Administered 2018-06-27: 1000 mg via INTRAVENOUS

## 2018-06-27 MED ORDER — KETAMINE HCL 50 MG/ML IJ SOLN
INTRAMUSCULAR | Status: AC
  Filled 2018-06-27: qty 10

## 2018-06-27 MED ORDER — GLYCOPYRROLATE 0.2 MG/ML IJ SOLN
INTRAMUSCULAR | Status: DC | PRN
  Administered 2018-06-27: 0.2 mg via INTRAVENOUS

## 2018-06-27 MED ORDER — LIDOCAINE HCL (CARDIAC) PF 100 MG/5ML IV SOSY
PREFILLED_SYRINGE | INTRAVENOUS | Status: DC | PRN
  Administered 2018-06-27: 50 mg via INTRAVENOUS

## 2018-06-27 MED ORDER — DEXTROSE 5 % IV SOLN
3.0000 g | Freq: Once | INTRAVENOUS | Status: AC
  Administered 2018-06-27: 3 g via INTRAVENOUS
  Filled 2018-06-27: qty 3000

## 2018-06-27 MED ORDER — FENTANYL CITRATE (PF) 100 MCG/2ML IJ SOLN
INTRAMUSCULAR | Status: DC | PRN
  Administered 2018-06-27 (×4): 50 ug via INTRAVENOUS
  Administered 2018-06-27: 100 ug via INTRAVENOUS
  Administered 2018-06-27: 50 ug via INTRAVENOUS

## 2018-06-27 MED ORDER — METHYLPREDNISOLONE ACETATE 40 MG/ML IJ SUSP
INTRAMUSCULAR | Status: AC
  Filled 2018-06-27: qty 1

## 2018-06-27 MED ORDER — MIDAZOLAM HCL 2 MG/2ML IJ SOLN
INTRAMUSCULAR | Status: DC | PRN
  Administered 2018-06-27: 2 mg via INTRAVENOUS

## 2018-06-27 MED ORDER — METHOCARBAMOL 500 MG PO TABS: 500.0000 mg | ORAL_TABLET | Freq: Four times a day (QID) | ORAL | 0 refills | Status: DC | PRN

## 2018-06-27 MED ORDER — PROMETHAZINE HCL 25 MG/ML IJ SOLN: 6.2500 mg | INTRAMUSCULAR | Status: DC | PRN

## 2018-06-27 MED ORDER — OXYCODONE HCL 5 MG PO TABS: 5.0000 mg | ORAL_TABLET | Freq: Once | ORAL | Status: DC | PRN

## 2018-06-27 MED ORDER — DEXAMETHASONE SODIUM PHOSPHATE 10 MG/ML IJ SOLN
INTRAMUSCULAR | Status: AC
  Filled 2018-06-27: qty 1

## 2018-06-27 MED ORDER — DEXAMETHASONE SODIUM PHOSPHATE 10 MG/ML IJ SOLN
INTRAMUSCULAR | Status: DC | PRN
  Administered 2018-06-27: 10 mg via INTRAVENOUS

## 2018-06-27 MED ORDER — BACITRACIN 50000 UNITS IM SOLR
INTRAMUSCULAR | Status: AC
  Filled 2018-06-27: qty 1

## 2018-06-27 MED ORDER — GLYCOPYRROLATE 0.2 MG/ML IJ SOLN
INTRAMUSCULAR | Status: AC
  Filled 2018-06-27: qty 1

## 2018-06-27 MED ORDER — KETAMINE HCL 50 MG/ML IJ SOLN
INTRAMUSCULAR | Status: DC | PRN
  Administered 2018-06-27: 25 mg via INTRAMUSCULAR
  Administered 2018-06-27: 25 mg via INTRAVENOUS

## 2018-06-27 MED ORDER — MIDAZOLAM HCL 2 MG/2ML IJ SOLN
INTRAMUSCULAR | Status: AC
  Filled 2018-06-27: qty 2

## 2018-06-27 MED ORDER — SUCCINYLCHOLINE CHLORIDE 20 MG/ML IJ SOLN
INTRAMUSCULAR | Status: DC | PRN
  Administered 2018-06-27: 100 mg via INTRAVENOUS

## 2018-06-27 MED ORDER — ACETAMINOPHEN 10 MG/ML IV SOLN
INTRAVENOUS | Status: AC
  Filled 2018-06-27: qty 100

## 2018-06-27 MED ORDER — OXYCODONE HCL 5 MG PO TABS: 5.0000 mg | ORAL_TABLET | ORAL | 0 refills | Status: DC | PRN

## 2018-06-27 MED ORDER — SUCCINYLCHOLINE CHLORIDE 20 MG/ML IJ SOLN
INTRAMUSCULAR | Status: AC
  Filled 2018-06-27: qty 1

## 2018-06-27 MED ORDER — FENTANYL CITRATE (PF) 100 MCG/2ML IJ SOLN
INTRAMUSCULAR | Status: AC
  Filled 2018-06-27: qty 2

## 2018-06-27 MED ORDER — THROMBIN 5000 UNITS EX SOLR
CUTANEOUS | Status: DC | PRN
  Administered 2018-06-27: 5000 [IU] via TOPICAL

## 2018-06-27 MED ORDER — ROCURONIUM BROMIDE 50 MG/5ML IV SOLN
INTRAVENOUS | Status: AC
  Filled 2018-06-27: qty 1

## 2018-06-27 MED ORDER — PROPOFOL 10 MG/ML IV BOLUS
INTRAVENOUS | Status: DC | PRN
  Administered 2018-06-27: 200 mg via INTRAVENOUS

## 2018-06-27 MED ORDER — LACTATED RINGERS IV SOLN
INTRAVENOUS | Status: DC
  Administered 2018-06-27: 07:00:00 via INTRAVENOUS

## 2018-06-27 MED ORDER — METHYLPREDNISOLONE ACETATE 40 MG/ML IJ SUSP
INTRAMUSCULAR | Status: DC | PRN
  Administered 2018-06-27: 40 mg

## 2018-06-27 MED ORDER — BUPIVACAINE HCL (PF) 0.5 % IJ SOLN
INTRAMUSCULAR | Status: AC
  Filled 2018-06-27: qty 30

## 2018-06-27 MED ORDER — THROMBIN 5000 UNITS EX SOLR
CUTANEOUS | Status: AC
  Filled 2018-06-27: qty 5000

## 2018-06-27 MED ORDER — SODIUM CHLORIDE FLUSH 0.9 % IV SOLN
INTRAVENOUS | Status: AC
  Filled 2018-06-27: qty 20

## 2018-06-27 MED ORDER — SODIUM CHLORIDE 0.9 % IV SOLN
INTRAVENOUS | Status: DC | PRN
  Administered 2018-06-27: 1000 mL

## 2018-06-27 MED ORDER — ONDANSETRON HCL 4 MG/2ML IJ SOLN
INTRAMUSCULAR | Status: DC | PRN
  Administered 2018-06-27: 4 mg via INTRAVENOUS

## 2018-06-27 MED ORDER — ROCURONIUM BROMIDE 100 MG/10ML IV SOLN
INTRAVENOUS | Status: DC | PRN
  Administered 2018-06-27: 5 mg via INTRAVENOUS
  Administered 2018-06-27: 25 mg via INTRAVENOUS

## 2018-06-27 MED ORDER — DEXMEDETOMIDINE HCL IN NACL 200 MCG/50ML IV SOLN
INTRAVENOUS | Status: DC | PRN
  Administered 2018-06-27: 20 ug via INTRAVENOUS

## 2018-06-27 MED ORDER — FENTANYL CITRATE (PF) 100 MCG/2ML IJ SOLN: 25.0000 ug | INTRAMUSCULAR | Status: DC | PRN

## 2018-06-27 MED ORDER — BUPIVACAINE-EPINEPHRINE (PF) 0.5% -1:200000 IJ SOLN
INTRAMUSCULAR | Status: DC | PRN
  Administered 2018-06-27: 4 mL

## 2018-06-27 MED ORDER — MEPERIDINE HCL 50 MG/ML IJ SOLN: 6.2500 mg | INTRAMUSCULAR | Status: DC | PRN

## 2018-06-27 MED ORDER — GELATIN ABSORBABLE 12-7 MM EX MISC
CUTANEOUS | Status: AC
  Filled 2018-06-27: qty 1

## 2018-06-27 MED ORDER — FAMOTIDINE 20 MG PO TABS
20.0000 mg | ORAL_TABLET | Freq: Once | ORAL | Status: AC
  Administered 2018-06-27: 20 mg via ORAL

## 2018-06-27 MED ORDER — OXYCODONE HCL 5 MG/5ML PO SOLN: 5.0000 mg | Freq: Once | ORAL | Status: DC | PRN

## 2018-06-27 MED ORDER — ONDANSETRON HCL 4 MG/2ML IJ SOLN
INTRAMUSCULAR | Status: AC
  Filled 2018-06-27: qty 2

## 2018-06-27 SURGICAL SUPPLY — 62 items
BLADE BOVIE TIP EXT 4 (BLADE) ×3 IMPLANT
BUR NEURO DRILL SOFT 3.0X3.8M (BURR) ×3 IMPLANT
CANISTER SUCT 1200ML W/VALVE (MISCELLANEOUS) ×3 IMPLANT
CHLORAPREP W/TINT 26ML (MISCELLANEOUS) ×3 IMPLANT
CNTNR SPEC 2.5X3XGRAD LEK (MISCELLANEOUS) ×1
CONT SPEC 4OZ STER OR WHT (MISCELLANEOUS) ×2
CONTAINER SPEC 2.5X3XGRAD LEK (MISCELLANEOUS) ×1 IMPLANT
COUNTER NEEDLE 20/40 LG (NEEDLE) ×3 IMPLANT
COVER LIGHT HANDLE STERIS (MISCELLANEOUS) ×6 IMPLANT
COVER WAND RF STERILE (DRAPES) IMPLANT
DERMABOND ADVANCED (GAUZE/BANDAGES/DRESSINGS) ×2
DERMABOND ADVANCED .7 DNX12 (GAUZE/BANDAGES/DRESSINGS) ×1 IMPLANT
DRAPE C-ARM 42X70 (DRAPES) ×6 IMPLANT
DRAPE LAPAROTOMY 100X77 ABD (DRAPES) ×3 IMPLANT
DRAPE MICROSCOPE SPINE 48X150 (DRAPES) ×3 IMPLANT
DRAPE POUCH INSTRU U-SHP 10X18 (DRAPES) ×3 IMPLANT
DRAPE SURG 17X11 SM STRL (DRAPES) ×3 IMPLANT
DRSG TEGADERM 2-3/8X2-3/4 SM (GAUZE/BANDAGES/DRESSINGS) ×3 IMPLANT
DRSG TELFA 4X3 1S NADH ST (GAUZE/BANDAGES/DRESSINGS) ×3 IMPLANT
DURASEAL APPLICATOR TIP (TIP) IMPLANT
DURASEAL SPINE SEALANT 3ML (MISCELLANEOUS) IMPLANT
ELECT CAUTERY BLADE TIP 2.5 (TIP) ×3
ELECT EZSTD 165MM 6.5IN (MISCELLANEOUS) ×3
ELECT REM PT RETURN 9FT ADLT (ELECTROSURGICAL) ×3
ELECTRODE CAUTERY BLDE TIP 2.5 (TIP) ×1 IMPLANT
ELECTRODE EZSTD 165MM 6.5IN (MISCELLANEOUS) ×1 IMPLANT
ELECTRODE REM PT RTRN 9FT ADLT (ELECTROSURGICAL) ×1 IMPLANT
GAUZE SPONGE 4X4 12PLY STRL (GAUZE/BANDAGES/DRESSINGS) ×3 IMPLANT
GLOVE BIOGEL PI IND STRL 8 (GLOVE) ×1 IMPLANT
GLOVE BIOGEL PI INDICATOR 8 (GLOVE) ×2
GLOVE INDICATOR 8.0 STRL GRN (GLOVE) ×3 IMPLANT
GLOVE SURG SYN 7.0 (GLOVE) ×6 IMPLANT
GLOVE SURG SYN 8.0 (GLOVE) ×6 IMPLANT
GOWN STRL REUS W/ TWL LRG LVL3 (GOWN DISPOSABLE) ×1 IMPLANT
GOWN STRL REUS W/ TWL XL LVL3 (GOWN DISPOSABLE) ×1 IMPLANT
GOWN STRL REUS W/TWL LRG LVL3 (GOWN DISPOSABLE) ×2
GOWN STRL REUS W/TWL XL LVL3 (GOWN DISPOSABLE) ×2
GRADUATE 1200CC STRL 31836 (MISCELLANEOUS) ×3 IMPLANT
KIT TURNOVER KIT A (KITS) ×3 IMPLANT
KIT WILSON FRAME (KITS) ×3 IMPLANT
KNIFE BAYONET SHORT DISCETOMY (MISCELLANEOUS) ×6 IMPLANT
MARKER SKIN DUAL TIP RULER LAB (MISCELLANEOUS) ×6 IMPLANT
NDL SAFETY ECLIPSE 18X1.5 (NEEDLE) ×3 IMPLANT
NEEDLE HYPO 18GX1.5 SHARP (NEEDLE) ×6
NEEDLE HYPO 22GX1.5 SAFETY (NEEDLE) ×3 IMPLANT
NS IRRIG 1000ML POUR BTL (IV SOLUTION) ×3 IMPLANT
PACK LAMINECTOMY NEURO (CUSTOM PROCEDURE TRAY) ×3 IMPLANT
PAD ARMBOARD 7.5X6 YLW CONV (MISCELLANEOUS) ×3 IMPLANT
RETRACTOR TUBE METRX SPIN 22X7 (INSTRUMENTS) ×3 IMPLANT
SPOGE SURGIFLO 8M (HEMOSTASIS) ×2
SPONGE SURGIFLO 8M (HEMOSTASIS) ×1 IMPLANT
SUT NURALON 4 0 TR CR/8 (SUTURE) IMPLANT
SUT POLYSORB 2-0 5X18 GS-10 (SUTURE) ×6 IMPLANT
SUT VIC AB 0 CT1 18XCR BRD 8 (SUTURE) ×1 IMPLANT
SUT VIC AB 0 CT1 8-18 (SUTURE) ×2
SYR 20CC LL (SYRINGE) ×3 IMPLANT
SYR 3ML LL SCALE MARK (SYRINGE) ×3 IMPLANT
SYR TB 1ML 27GX1/2 LL (SYRINGE) ×3 IMPLANT
TOWEL OR 17X26 4PK STRL BLUE (TOWEL DISPOSABLE) ×9 IMPLANT
TRAY FOLEY MTR SLVR 16FR STAT (SET/KITS/TRAYS/PACK) IMPLANT
TUBING CONNECTING 10 (TUBING) ×2 IMPLANT
TUBING CONNECTING 10' (TUBING) ×1

## 2018-06-27 NOTE — Discharge Summary (Signed)
Procedure: Left L4-5 and left L5-S1 lumbar decompression Procedure date: 06/27/2018 Diagnosis: Lumbar radiculopathy  History: Tony Gibson is s/p L4-5 and L5-S1 lumbar decompression for lumbar radiculopathy.  POD0: Tolerated procedure well without complication.  Evaluated postoperatively still disoriented from anesthesia but able to answer questions and obey commands.  Left lower extremity pain has resolved.  Denies any other new pain/numbness/tingling in lower extremities, denies back pain.  Physical Exam: Vitals:   06/27/18 0627  BP: (!) 148/95  Pulse: 96  Resp: (!) 22  Temp: 99.8 F (37.7 C)  SpO2: 97%   Strength:5/5 throughout lower extremities Sensation: Intact and symmetric throughout upper and lower extremities Skin: Dressing clean and dry  Data:  Recent Labs  Lab 06/24/18 1425  NA 135  K 3.8  CL 97*  CO2 28  BUN 17  CREATININE 0.72  GLUCOSE 122*  CALCIUM 9.3   No results for input(s): AST, ALT, ALKPHOS in the last 168 hours.  Invalid input(s): TBILI   Recent Labs  Lab 06/24/18 1425  WBC 15.4*  HGB 16.4  HCT 48.3  PLT 345   Recent Labs  Lab 06/24/18 1425  APTT 26  INR 0.9         Other tests/results: No imaging reviewed  Assessment/Plan:  Tony Gibson is POD 0 status post L4-5 and L5-S1 lumbar decompression for lumbar radiculopathy.  Recovering well.  Symptoms that were present prior to surgery are resolved at this time.  We will continue pain control with oxycodone, Robaxin, Tylenol as needed.  He is scheduled to follow-up in clinic in approximately 2 weeks to monitor progress.  Marin Olp PA-C Department of Neurosurgery

## 2018-06-27 NOTE — Anesthesia Procedure Notes (Signed)
Procedure Name: Intubation °Performed by: Azhia Siefken, CRNA °Pre-anesthesia Checklist: Patient identified, Patient being monitored, Timeout performed, Emergency Drugs available and Suction available °Patient Re-evaluated:Patient Re-evaluated prior to induction °Oxygen Delivery Method: Circle system utilized °Preoxygenation: Pre-oxygenation with 100% oxygen °Induction Type: IV induction °Ventilation: Mask ventilation without difficulty °Laryngoscope Size: McGraph and 4 °Grade View: Grade I °Tube type: Oral °Tube size: 7.5 mm °Number of attempts: 1 °Airway Equipment and Method: Stylet and Video-laryngoscopy °Placement Confirmation: ETT inserted through vocal cords under direct vision,  positive ETCO2 and breath sounds checked- equal and bilateral °Secured at: 23 cm °Tube secured with: Tape °Dental Injury: Teeth and Oropharynx as per pre-operative assessment  ° ° ° ° ° ° °

## 2018-06-27 NOTE — Anesthesia Post-op Follow-up Note (Signed)
Anesthesia QCDR form completed.        

## 2018-06-27 NOTE — Discharge Instructions (Addendum)
°Your surgeon has performed an operation on your lumbar spine (low back) to relieve pressure on one or more nerves. Many times, patients feel better immediately after surgery and can “overdo it.” Even if you feel well, it is important that you follow these activity guidelines. If you do not let your back heal properly from the surgery, you can increase the chance of a disc herniation and/or return of your symptoms. The following are instructions to help in your recovery once you have been discharged from the hospital. ° °* Do not take anti-inflammatory medications for 3 days after surgery (naproxen [Aleve], ibuprofen [Advil, Motrin], celecoxib [Celebrex], etc.) ° °Activity  °  °No bending, lifting, or twisting (“BLT”). Avoid lifting objects heavier than 10 pounds (gallon milk jug).  Where possible, avoid household activities that involve lifting, bending, pushing, or pulling such as laundry, vacuuming, grocery shopping, and childcare. Try to arrange for help from friends and family for these activities while your back heals. ° °Increase physical activity slowly as tolerated.  Taking short walks is encouraged, but avoid strenuous exercise. Do not jog, run, bicycle, lift weights, or participate in any other exercises unless specifically allowed by your doctor. Avoid prolonged sitting, including car rides. ° °Talk to your doctor before resuming sexual activity. ° °You should not drive until cleared by your doctor. ° °Until released by your doctor, you should not return to work or school.  You should rest at home and let your body heal.  ° °You may shower two days after your surgery.  After showering, lightly dab your incision dry. Do not take a tub bath or go swimming for 3 weeks, or until approved by your doctor at your follow-up appointment. ° °If you smoke, we strongly recommend that you quit.  Smoking has been proven to interfere with normal healing in your back and will dramatically reduce the success rate of  your surgery. Please contact [facility] (800-QUIT-NOW) and use the resources at www.[facility].com for assistance in stopping smoking. ° °Surgical Incision °  °If you have a dressing on your incision, you may remove it three days after your surgery. Keep your incision area clean and dry. ° °If you have staples or stitches on your incision, you should have a follow up scheduled for removal. If you do not have staples or stitches, you will have steri-strips (small pieces of surgical tape) or Dermabond glue. The steri-strips/glue should begin to peel away within about a week (it is fine if the steri-strips fall off before then). If the strips are still in place one week after your surgery, you may gently remove them. ° °Diet          ° ° You may return to your usual diet. Be sure to stay hydrated. ° °When to Contact Us ° °Although your surgery and recovery will likely be uneventful, you may have some residual numbness, aches, and pains in your back and/or legs. This is normal and should improve in the next few weeks. ° °However, should you experience any of the following, contact us immediately: °• New numbness or weakness °• Pain that is progressively getting worse, and is not relieved by your pain medications or rest °• Bleeding, redness, swelling, pain, or drainage from surgical incision °• Chills or flu-like symptoms °• Fever greater than 101.0 F (38.3 C) °• Problems with bowel or bladder functions °• Difficulty breathing or shortness of breath °• Warmth, tenderness, or swelling in your calf ° °Contact Information °• During office hours (Monday-Friday   9 am to 5 pm), please call your physician at [phone] °• After hours and weekends, please call the [facility] at [phone] and ask for the Neurosurgery Resident On Call  °• For a life-threatening emergency, call 911 ° °AMBULATORY SURGERY  °DISCHARGE INSTRUCTIONS ° ° °1) The drugs that you were given will stay in your system until tomorrow so for the next 24  hours you should not: ° °A) Drive an automobile °B) Make any legal decisions °C) Drink any alcoholic beverage ° ° °2) You may resume regular meals tomorrow.  Today it is better to start with liquids and gradually work up to solid foods. ° °You may eat anything you prefer, but it is better to start with liquids, then soup and crackers, and gradually work up to solid foods. ° ° °3) Please notify your doctor immediately if you have any unusual bleeding, trouble breathing, redness and pain at the surgery site, drainage, fever, or pain not relieved by medication. ° ° ° °4) Additional Instructions: ° °Please contact your physician with any problems or Same Day Surgery at [phone], Monday through Friday 6 am to 4 pm, or [facility] number at [phone]. ° °

## 2018-06-27 NOTE — Anesthesia Preprocedure Evaluation (Signed)
Anesthesia Evaluation  Patient identified by MRN, date of birth, ID band Patient awake    Reviewed: Allergy & Precautions, NPO status , Patient's Chart, lab work & pertinent test results  History of Anesthesia Complications Negative for: history of anesthetic complications  Airway Mallampati: III  TM Distance: >3 FB Neck ROM: Full    Dental no notable dental hx.    Pulmonary neg sleep apnea, neg COPD, Current Smoker,    breath sounds clear to auscultation- rhonchi (-) wheezing      Cardiovascular Exercise Tolerance: Good hypertension, Pt. on medications (-) CAD, (-) Past MI, (-) Cardiac Stents and (-) CABG  Rhythm:Regular Rate:Normal - Systolic murmurs and - Diastolic murmurs    Neuro/Psych neg Seizures PSYCHIATRIC DISORDERS Anxiety Depression negative neurological ROS     GI/Hepatic negative GI ROS, Neg liver ROS,   Endo/Other  negative endocrine ROSneg diabetes  Renal/GU negative Renal ROS     Musculoskeletal negative musculoskeletal ROS (+)   Abdominal (+) + obese,   Peds  Hematology negative hematology ROS (+)   Anesthesia Other Findings Past Medical History: No date: Anxiety No date: Depression No date: HLD (hyperlipidemia) No date: Hypertension   Reproductive/Obstetrics                             Anesthesia Physical Anesthesia Plan  ASA: II  Anesthesia Plan: General   Post-op Pain Management:    Induction: Intravenous  PONV Risk Score and Plan: 0 and Ondansetron  Airway Management Planned: Oral ETT  Additional Equipment:   Intra-op Plan:   Post-operative Plan: Extubation in OR  Informed Consent: I have reviewed the patients History and Physical, chart, labs and discussed the procedure including the risks, benefits and alternatives for the proposed anesthesia with the patient or authorized representative who has indicated his/her understanding and acceptance.      Dental advisory given  Plan Discussed with: CRNA and Anesthesiologist  Anesthesia Plan Comments:         Anesthesia Quick Evaluation

## 2018-06-27 NOTE — Transfer of Care (Signed)
Immediate Anesthesia Transfer of Care Note  Patient: Tony Gibson  Procedure(s) Performed: LUMBAR HEMI LAMINECTOMY MICRODISCECTOMY 2 LEVELS LEFT L4/5, LEFT L5/S1 (Left )  Patient Location: PACU  Anesthesia Type:General  Level of Consciousness: sedated  Airway & Oxygen Therapy: Patient Spontanous Breathing and Patient connected to face mask oxygen  Post-op Assessment: Report given to RN and Post -op Vital signs reviewed and stable  Post vital signs: Reviewed  Last Vitals:  Vitals Value Taken Time  BP 132/91 06/27/2018 10:42 AM  Temp    Pulse 110 06/27/2018 10:42 AM  Resp 18 06/27/2018 10:42 AM  SpO2 96 % 06/27/2018 10:42 AM  Vitals shown include unvalidated device data.  Last Pain:  Vitals:   06/27/18 2542  TempSrc: Tympanic  PainSc: 10-Worst pain ever         Complications: No apparent anesthesia complications

## 2018-06-27 NOTE — Progress Notes (Signed)
Tony Olp  PA  In to check pt reflexes and strengh

## 2018-06-27 NOTE — H&P (Signed)
Tony Gibson is an 48 y.o. male.   Chief Complaint: left leg pain HPI: Tony Gibson returns today after having been seen by our clinic last year with bilateral leg pain. He underwent epidural steroid injections which did help. He says more recently he has been having episodic instances of pain which limit what he can do at work. Most recently, he is now dealing with severe left sided back pain traveling down the leg to the lateral part of the calf into the foot. He does have numbness in his toes. This pain is limiting what he can do. He is started back on gabapentin and got oxycodone prescribed. He has not noticed any relief of pain from this. He had an updated MRI of his lumbar spine. It shows stenosis at L4/5 and L5/S1. He is here for surgical decompression.    Past Medical History:  Diagnosis Date  . Anxiety   . Depression   . HLD (hyperlipidemia)   . Hypertension     Past Surgical History:  Procedure Laterality Date  . APPENDECTOMY    . ELBOW SURGERY      No family history on file. Social History:  reports that he has been smoking cigarettes. He has been smoking about 1.00 pack per day. He has never used smokeless tobacco. He reports current alcohol use. No history on file for drug.  Allergies:  Allergies  Allergen Reactions  . Enalapril Swelling    Throat, mouth, and feet swelling  . Zithromax [Azithromycin] Swelling    Facial swelling    Medications Prior to Admission  Medication Sig Dispense Refill  . albuterol (VENTOLIN HFA) 108 (90 Base) MCG/ACT inhaler Inhale 2 puffs into the lungs every 4 (four) hours as needed for wheezing.    Marland Kitchen amLODipine (NORVASC) 10 MG tablet Take 10 mg by mouth daily.    Marland Kitchen aspirin EC 81 MG tablet Take 81 mg by mouth daily.    . busPIRone (BUSPAR) 5 MG tablet Take 5 mg by mouth 2 (two) times daily.    Marland Kitchen gabapentin (NEURONTIN) 300 MG capsule Take 300 mg by mouth at bedtime.     . hydrochlorothiazide (HYDRODIURIL) 25 MG tablet Take 25 mg by  mouth daily.    Marland Kitchen losartan (COZAAR) 100 MG tablet Take 100 mg by mouth daily.    Marland Kitchen lovastatin (MEVACOR) 10 MG tablet Take 10 mg by mouth at bedtime.    Marland Kitchen PARoxetine (PAXIL) 40 MG tablet Take 40 mg by mouth at bedtime.     . lidocaine (LIDODERM) 5 % Place 1 patch onto the skin every 12 (twelve) hours. Remove & Discard patch within 12 hours or as directed by MD 10 patch 0  . oxyCODONE-acetaminophen (PERCOCET) 7.5-325 MG tablet Take 1 tablet by mouth every 6 (six) hours as needed. (Patient not taking: Reported on 06/22/2018) 20 tablet 0    No results found for this or any previous visit (from the past 48 hour(s)). No results found.  ROS  General ROS: Negative Psychological ROS: Negative Ophthalmic ROS: Negative ENT ROS: Negative Hematological and Lymphatic ROS: Negative  Endocrine ROS: Negative Respiratory ROS: Negative Cardiovascular ROS: Negative Gastrointestinal ROS: Negative Genito-Urinary ROS: Negative Musculoskeletal ROS: Negative Neurological ROS: Positive for left leg pain, numbness Dermatological ROS: Negative   Blood pressure (!) 148/95, pulse 96, temperature 99.8 F (37.7 C), temperature source Tympanic, resp. rate (!) 22, height 6\' 1"  (1.854 m), SpO2 97 %. Physical Exam  General appearance: Alert, cooperative, in no acute distress Head: Normocephalic, atraumatic  Eyes: Normal, EOM intact Oropharynx: Moist without lesions Back: No tenderness to palpation of the midline, some tenderness to palpation of the left paramedian area Ext: No edema in LE bilaterally CV: Regular rate Pulm: Clear to auscultation  Neurologic exam:  Mental status: alertness: alert, affect: normal Speech: fluent and clear Motor:strength symmetric 5/5, normal muscle mass and tone in bilateral lower extremities, severe pain elicited on all motions Sensory: Creased light touch over the lateral side of the calf and foot, otherwise intact Reflexes: 2+ and symmetric bilaterally for patella Gait:  Antalgic gait, using cane    MRI lumbar spine: There is a normal lordotic curvature. The alignment appears well-maintained. There is some degenerative disc disease noted at the lower lumbar regions. There is a large disc herniation L4-5 slightly eccentric to the left. This causes severe stenosis. There is a smaller disc herniation at L5-S1 on the left which results in mild stenosis.     Assessment/Plan -We will perform a left L4-5 and L5-S1 hemilaminectomy and discectomy.   Tony Perla, MD 06/27/2018, 6:35 AM

## 2018-06-27 NOTE — Interval H&P Note (Signed)
History and Physical Interval Note:  06/27/2018 6:37 AM  Tony Gibson  has presented today for surgery, with the diagnosis of LUMBAR RADICULOPATHY  The various methods of treatment have been discussed with the patient and family. After consideration of risks, benefits and other options for treatment, the patient has consented to  Procedure(s): LUMBAR HEMI LAMINECTOMY MICRODISCECTOMY 2 LEVELS LEFT L4/5, LEFT L5/S1 (Left) as a surgical intervention .  The patient's history has been reviewed, patient examined, no change in status, stable for surgery.  I have reviewed the patient's chart and labs.  Questions were answered to the patient's satisfaction.     Deetta Perla

## 2018-06-27 NOTE — Op Note (Signed)
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 Fredericksenpresented to clinic on2/25with ongoingleftleg pain.He had failed conservative management including PT, steroid courses, steroid injections, and prescription medications. MRI revealed left L4/5 and L5/S1stenosis compressingthetraversingnerve roots with a large disc herniation at L4/5.Therisks of surgery were explained to include hematoma, infection, damage to nerve roots, CSF leak, weakness, numbness, pain, need for future surgery including fusion, heart attack, and stroke.He elected to proceed with surgery for symptom relief.   Procedure The patient was brought to the OR after informed consent was obtained.He was given general anesthesia and intubated by the anesthesia service. Vascular access lines were placed.The patient was then placed prone on a Wilson frameensuring all pressure points were padded.Antibiotics were administered.A time-out was performed per protocol.   The patient was sterilely prepped and draped. Fluoroscopy confirmedL4/5interspaceandanincision was planned1.5cm off midlineon theleft.The incision was instilled withlocal anesthetic with epinephrine. The skin was opened sharply and the dissection taken to the fascia. This was incised and initial dilator placedthe spinous processes and lamina of L4on theleftout to the medial edge of the facet. Serial  dilatorswere inserted via fluoroscopy and the final62mm tube was placed at depth of7cm.  The microscope was brought into the field. The overlying muscle was removed from lamina and medial facet.Next, a matchstickdrill bit was used to remove theL4lamina centrallyand going laterally.The underlying ligament was freed and removed with combination of rongeurs. The decompression was taken caudal to the superior border ofL5and then the medial facet was identified. The dura was seen to be full and intact.Once all ligament and soft tissue was removed, attention was turned to inspection of the nerve root. The nerve root and thecal sacwas retractedand there was a large disc bulge seen. The disc space was coagulated and then entered sharply. There was egress of significant soft disc material which was removed. Next, curettes and blunt probes were used to remove all free disc material below the PLL. Once no disc was seen and disc space flat to level of surrounding endplates, the disc space was irrigated. The epidural space was palpated with blunt probe and found to have no compression. The L5 nerve root was seen to be free throughout the exposure. Hemostasis was achieved with cautery and floseal. The wound was irrigated.  Solumedrol was then placed along the traversing nerve root.  The dilators were removed and then the initial dilator placed onthe spinous process and lamina of L5on theleftout to the medial edge of the facet. Serial dilatorswere inserted via fluoroscopy and the final42mm tube was placed at depth of6cm.The overlying muscle was removed from lamina and medial facet.Next, a matchstickdrill bit was used to remove theL5lamina centrallyand going laterally.The underlying ligament was freed and removed with combination of rongeurs. The decompression was taken caudal to the superior border ofS1and then the medial facet was identified. The dura was seen to be full and intact.Once all  ligament and soft tissue was removed, attention was turned to inspection of the nerve root. The nerve root and thecal sacwas retractedand there was a no significant disc bulge seen. The lateral recess was decompressed until all ligament removed. The epidural space was palpated with blunt probe  and found to have no compression. The S1 nerve root was seen to be free throughout the exposure. Hemostasis was achieved with cautery and floseal. The wound was irrigated.  Solumedrol was then placed along the traversing nerve root. The dilators were removed. The microscope was removed.  Thefasciawas then closed using 0vicryl followed by thesubcutaneous and dermal layers with 2-0 vicryluntil the epidermis was well approximated. The skin was closed with Dermabond. A dressing was applied.  The patient was returned to supine position and extubated by the anesthesia service. The patient was then taken to the PACU for post-operative care wherehe was moving extremities symmetrically.   ESTIMATED BLOOD LOSS: 20cc  SPECIMENS None  IMPLANT None   I performed the case in its entiretywith assistance of PA, Corrie Mckusick, [place]

## 2018-06-27 NOTE — Anesthesia Postprocedure Evaluation (Signed)
Anesthesia Post Note  Patient: Tony Gibson  Procedure(s) Performed: LUMBAR HEMI LAMINECTOMY MICRODISCECTOMY 2 LEVELS LEFT L4/5, LEFT L5/S1 (Left )  Patient location during evaluation: PACU Anesthesia Type: General Level of consciousness: awake and alert and oriented Pain management: pain level controlled Vital Signs Assessment: post-procedure vital signs reviewed and stable Respiratory status: spontaneous breathing, nonlabored ventilation and respiratory function stable Cardiovascular status: blood pressure returned to baseline and stable Postop Assessment: no signs of nausea or vomiting Anesthetic complications: no     Last Vitals:  Vitals:   06/27/18 1132 06/27/18 1151  BP: 129/86 (!) 152/80  Pulse: 100 98  Resp: 13 19  Temp: 37.2 C 36.4 C  SpO2: 94% 95%    Last Pain:  Vitals:   06/27/18 1151  TempSrc: Oral  PainSc: 2                  [name]

## 2018-10-23 IMAGING — CR DG CHEST 2V
1 series · 2 of 2 positions shown · non-contrast
Comparison: None.

CLINICAL DATA: Chest pain for 1 day

EXAM:
CHEST  2 VIEW

[Series 1: dg chest 2 view · 0.14mm/px · 2 of 2 slices shown]
[im 1/2]
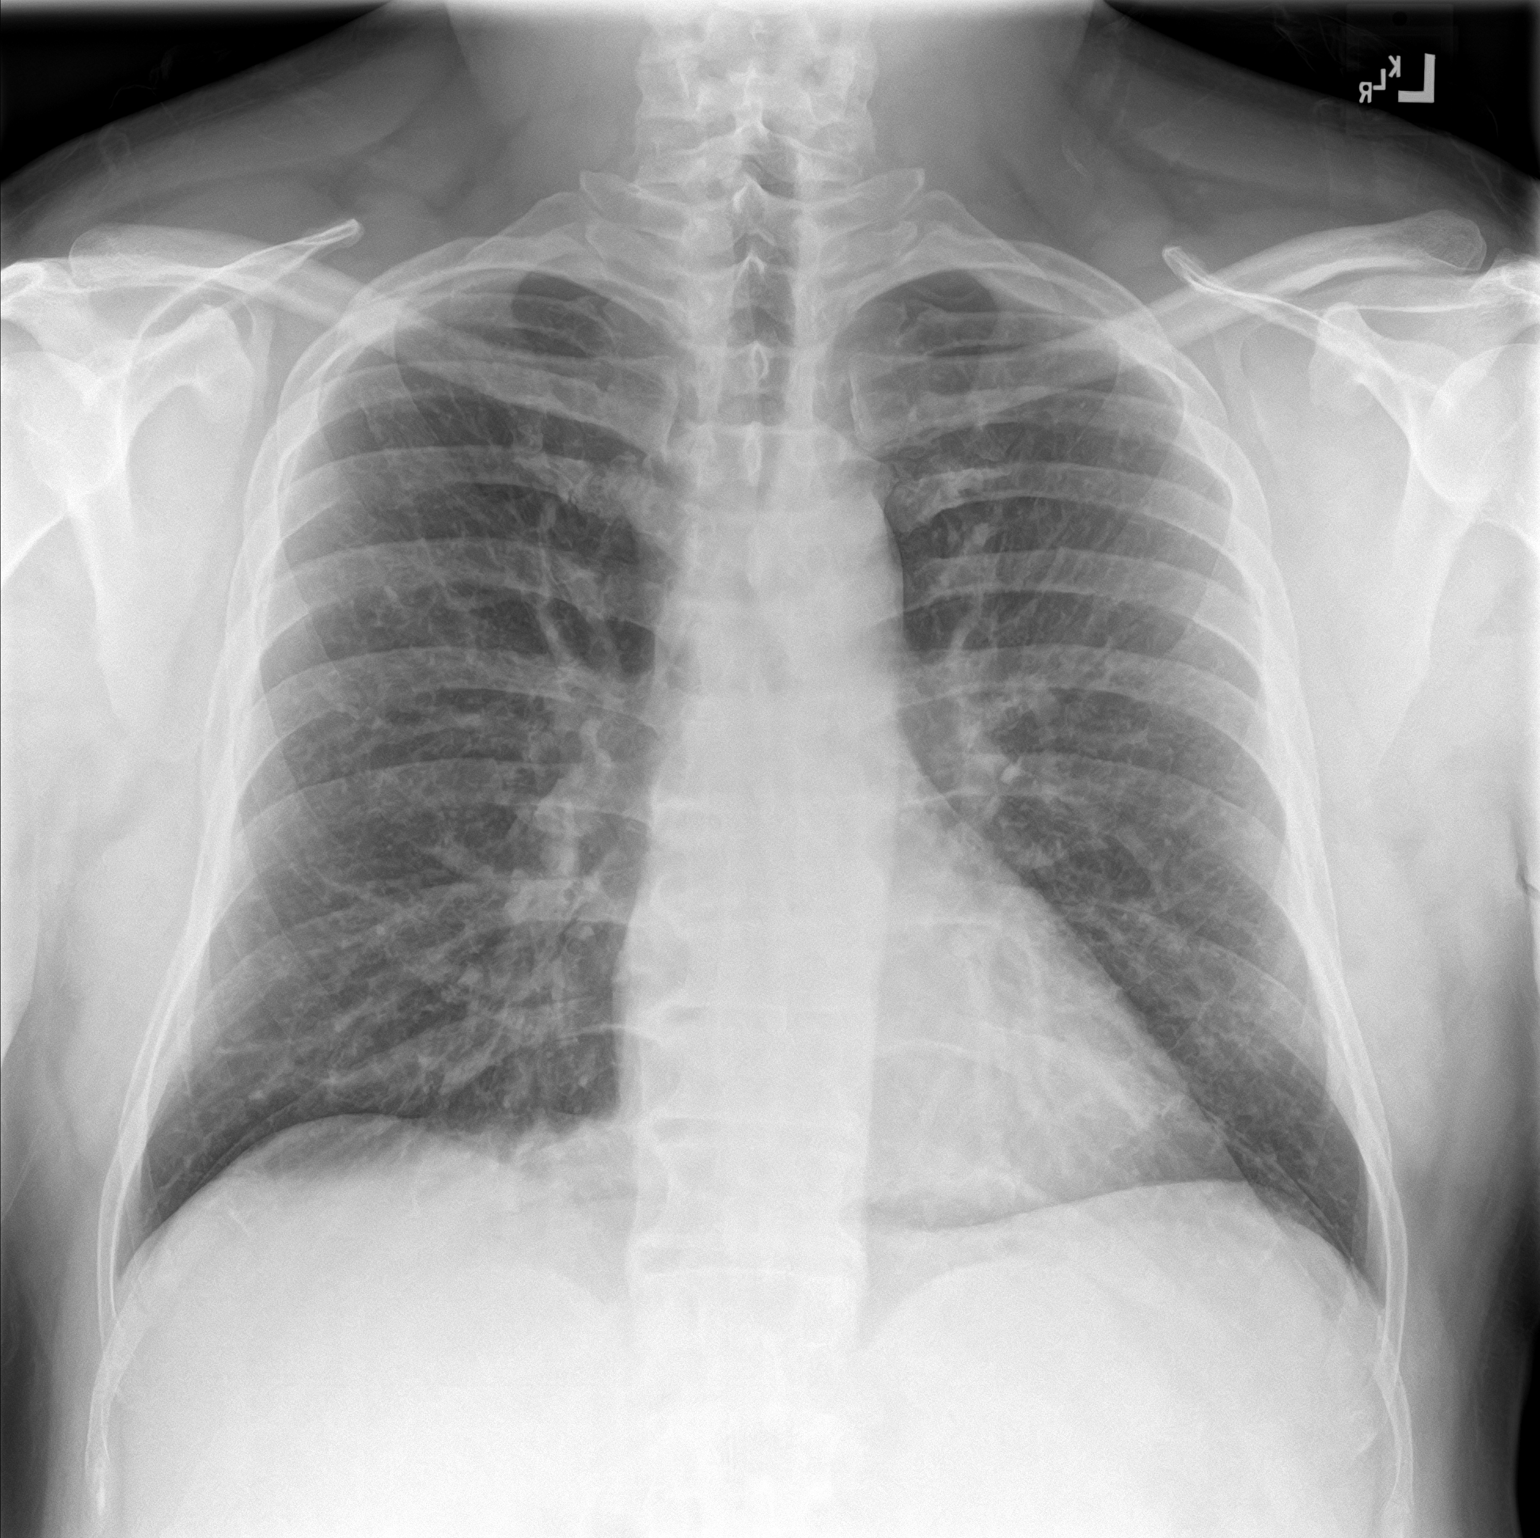
[im 2/2]
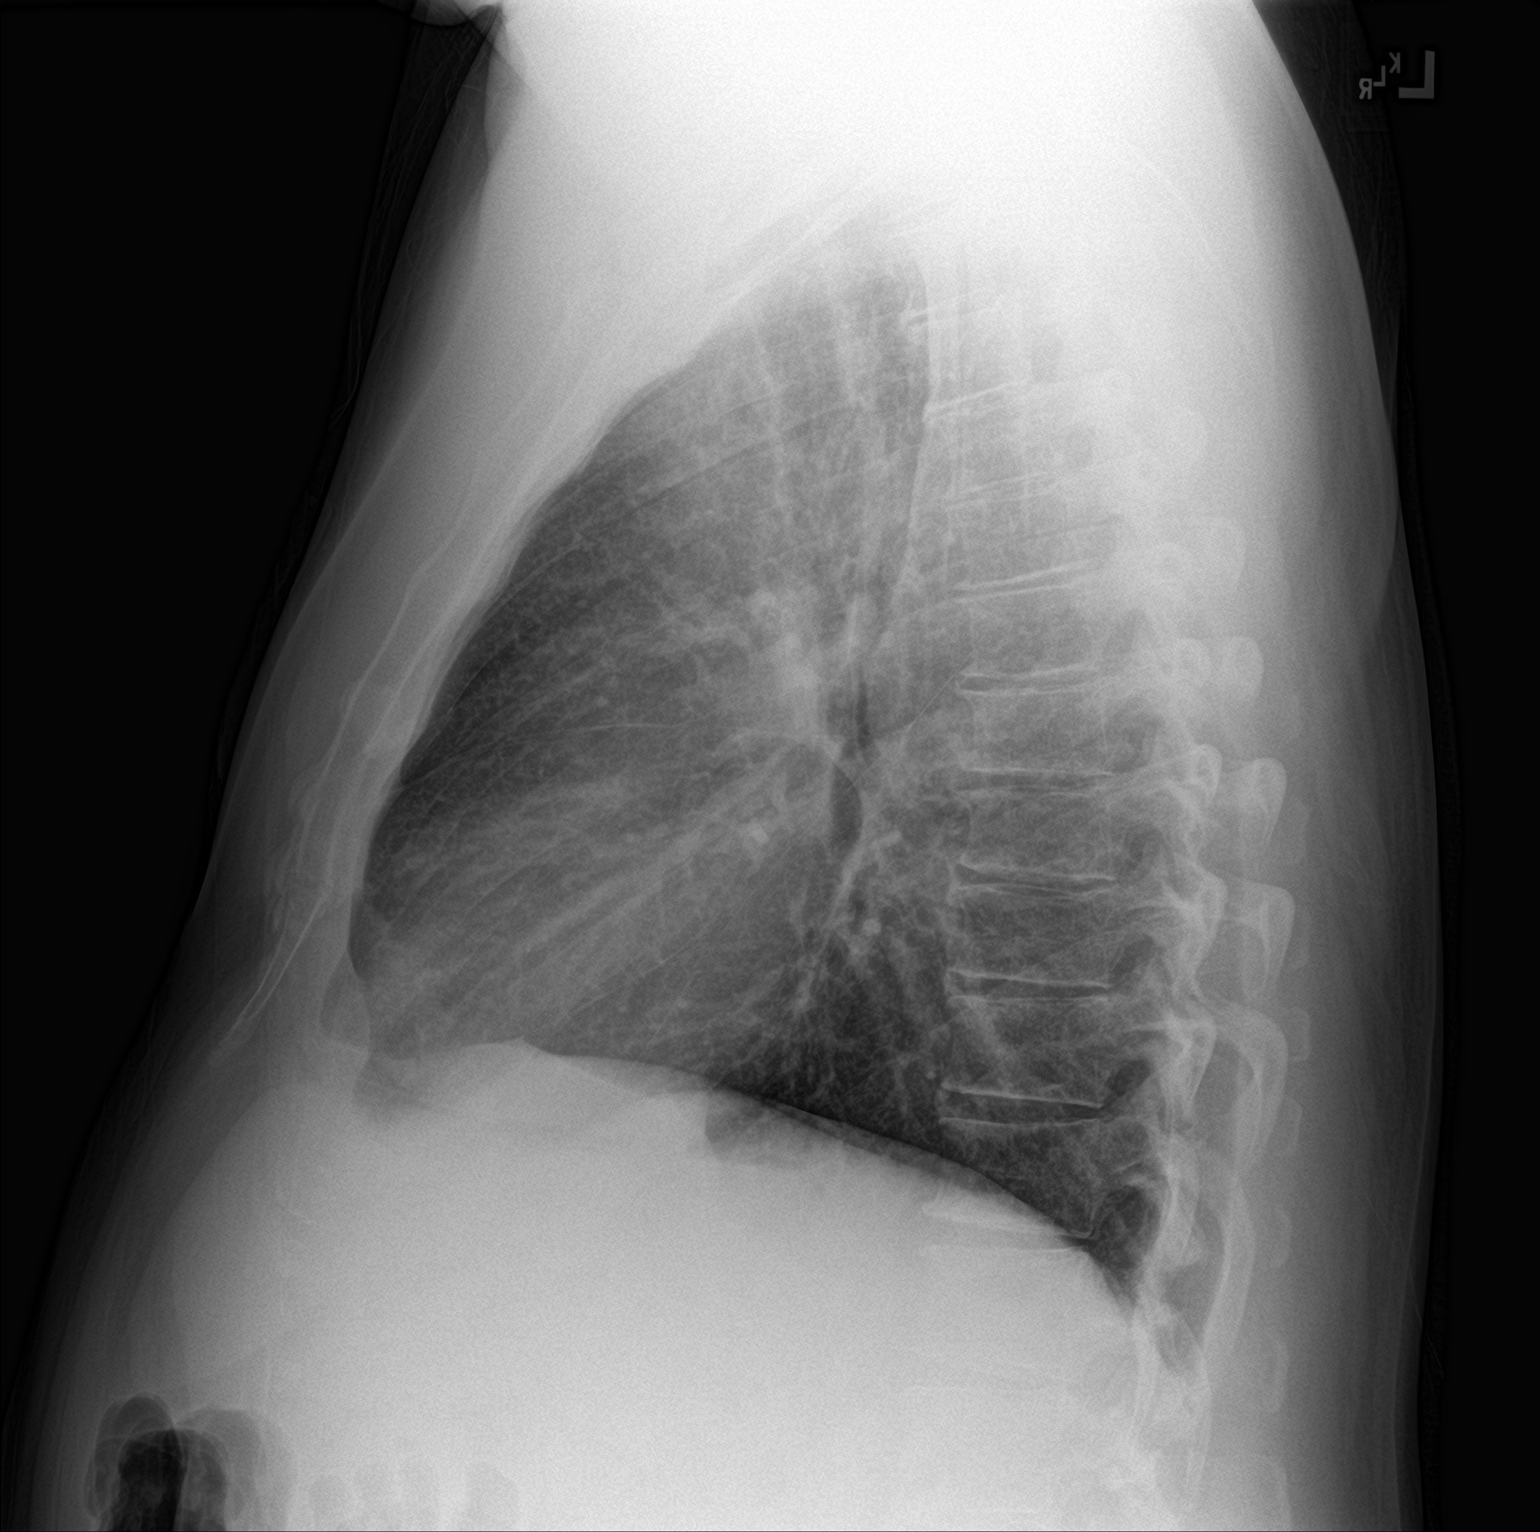

[2 of 2 positions shown; findings below may reference images not displayed]

FINDINGS: The heart size and mediastinal contours are within normal limits.
Both lungs are clear. The visualized skeletal structures are
unremarkable.
IMPRESSION: No active cardiopulmonary disease.

## 2018-10-31 ENCOUNTER — Other Ambulatory Visit: Payer: Self-pay

## 2018-10-31 ENCOUNTER — Emergency Department
Admission: EM | Admit: 2018-10-31 | Discharge: 2018-10-31 | Disposition: A | Discharge status: Home / Self Care | Payer: Managed Care, Other (non HMO) | Attending: Emergency Medicine | Admitting: Emergency Medicine

## 2018-10-31 ENCOUNTER — Emergency Department: Payer: Managed Care, Other (non HMO)

## 2018-10-31 DIAGNOSIS — T7840XA Allergy, unspecified, initial encounter: Secondary | ICD-10-CM | POA: Diagnosis present

## 2018-10-31 DIAGNOSIS — Z79899 Other long term (current) drug therapy: Secondary | ICD-10-CM | POA: Diagnosis not present

## 2018-10-31 DIAGNOSIS — I10 Essential (primary) hypertension: Secondary | ICD-10-CM | POA: Insufficient documentation

## 2018-10-31 DIAGNOSIS — F17210 Nicotine dependence, cigarettes, uncomplicated: Secondary | ICD-10-CM | POA: Diagnosis not present

## 2018-10-31 DIAGNOSIS — T783XXA Angioneurotic edema, initial encounter: Secondary | ICD-10-CM

## 2018-10-31 MED ORDER — IPRATROPIUM-ALBUTEROL 0.5-2.5 (3) MG/3ML IN SOLN
3.0000 mL | Freq: Once | RESPIRATORY_TRACT | Status: AC
  Administered 2018-10-31: 3 mL via RESPIRATORY_TRACT
  Filled 2018-10-31: qty 3

## 2018-10-31 MED ORDER — FAMOTIDINE IN NACL 20-0.9 MG/50ML-% IV SOLN
20.0000 mg | Freq: Once | INTRAVENOUS | Status: AC
  Administered 2018-10-31: 13:00:00 20 mg via INTRAVENOUS
  Filled 2018-10-31: qty 50

## 2018-10-31 MED ORDER — PREDNISONE 20 MG PO TABS
20.0000 mg | ORAL_TABLET | Freq: Once | ORAL | Status: AC
  Administered 2018-10-31: 16:00:00 20 mg via ORAL
  Filled 2018-10-31: qty 1

## 2018-10-31 MED ORDER — PREDNISONE 20 MG PO TABS: 20.0000 mg | ORAL_TABLET | Freq: Every day | ORAL | 0 refills | Status: AC

## 2018-10-31 MED ORDER — FAMOTIDINE 20 MG PO TABS: 20.0000 mg | ORAL_TABLET | Freq: Two times a day (BID) | ORAL | 0 refills | Status: DC

## 2018-10-31 MED ORDER — METHYLPREDNISOLONE SODIUM SUCC 125 MG IJ SOLR
125.0000 mg | Freq: Once | INTRAMUSCULAR | Status: AC
  Administered 2018-10-31: 13:00:00 125 mg via INTRAVENOUS
  Filled 2018-10-31: qty 2

## 2018-10-31 MED ORDER — DIPHENHYDRAMINE HCL 50 MG/ML IJ SOLN
50.0000 mg | Freq: Once | INTRAMUSCULAR | Status: AC
  Administered 2018-10-31: 50 mg via INTRAVENOUS
  Filled 2018-10-31: qty 1

## 2018-10-31 NOTE — Discharge Instructions (Signed)
The most likely cause for this is the losartan that you are taking.  Please stop that and just use the hydrochlorothiazide.  Please call Dr. Netty Starring and let him know that you will need a different antihypertensive.  Please return here if the swelling worsens .  Call 911 if you have any trouble with shortness of breath.  Tailors as needed.  Take Benadryl 2 of the over-the-counter pills 4 times a day today and tomorrow.  Take the Pepcid once tomorrow.  Take the prednisone once tomorrow as well.

## 2018-10-31 NOTE — ED Triage Notes (Signed)
Pt c/o having facial and lip swelling that started about 3 hrs ago, denies any known allergies pt does take ace inhibitors for his b/p,

## 2018-10-31 NOTE — ED Provider Notes (Signed)
[facility] Emergency Department Provider Note   ____________________________________________   First MD Initiated Contact with Patient 10/31/18 1237     (approximate)  I have reviewed the triage vital signs and the nursing notes.   HISTORY  Chief Complaint Allergic Reaction    HPI Tony Gibson is a 48 y.o. male he reports swelling of the left side of his lower lip.  Is been getting worse since about 3 hours ago.  He is never had this before.  He does take losartan/hydrochlorothiazide.  Also reports he has been using his inhaler a lot more than usual apparently due to the humidity.  He is not having any trouble speaking or breathing.  His O2 sats are low though 91-92% in the room.         Past Medical History:  Diagnosis Date  . Anxiety   . Depression   . HLD (hyperlipidemia)   . Hypertension     There are no active problems to display for this patient.   Past Surgical History:  Procedure Laterality Date  . APPENDECTOMY    . ELBOW SURGERY    . LUMBAR LAMINECTOMY/DECOMPRESSION MICRODISCECTOMY Left 06/27/2018   Procedure: LUMBAR HEMI LAMINECTOMY MICRODISCECTOMY 2 LEVELS LEFT L4/5, LEFT L5/S1;  Surgeon: Deetta Perla, MD;  Location: [facility] ORS;  Service: Neurosurgery;  Laterality: Left;    Prior to Admission medications   Medication Sig Start Date End Date Taking? Authorizing Provider  albuterol (VENTOLIN HFA) 108 (90 Base) MCG/ACT inhaler Inhale 2 puffs into the lungs every 4 (four) hours as needed for wheezing. 05/21/16   [provider]  amLODipine (NORVASC) 10 MG tablet Take 10 mg by mouth daily. 03/25/18   [provider]  busPIRone (BUSPAR) 5 MG tablet Take 5 mg by mouth 2 (two) times daily. 01/17/18   [provider]  famotidine (PEPCID) 20 MG tablet Take 1 tablet (20 mg total) by mouth 2 (two) times daily. Take 1 pill once in the morning tomorrow. 10/31/18 10/31/19  Nena Polio, MD  gabapentin (NEURONTIN) 300 MG  capsule Take 300 mg by mouth at bedtime.     [provider]  hydrochlorothiazide (HYDRODIURIL) 25 MG tablet Take 25 mg by mouth daily. 12/11/16   [provider]  lidocaine (LIDODERM) 5 % Place 1 patch onto the skin every 12 (twelve) hours. Remove & Discard patch within 12 hours or as directed by MD 06/15/18 06/15/19  Sable Feil, PA-C  losartan (COZAAR) 100 MG tablet Take 100 mg by mouth daily. 12/29/16   [provider]  lovastatin (MEVACOR) 10 MG tablet Take 10 mg by mouth at bedtime. 01/08/17   [provider]  methocarbamol (ROBAXIN) 500 MG tablet Take 1 tablet (500 mg total) by mouth every 6 (six) hours as needed for muscle spasms. 06/27/18   Marin Olp, PA-C  oxyCODONE (ROXICODONE) 5 MG immediate release tablet Take 1 tablet (5 mg total) by mouth every 4 (four) hours as needed for breakthrough pain. 06/27/18   Marin Olp, PA-C  PARoxetine (PAXIL) 40 MG tablet Take 40 mg by mouth at bedtime.  12/29/16   [provider]  predniSONE (DELTASONE) 20 MG tablet Take 1 tablet (20 mg total) by mouth daily. Take 1 pill tomorrow with the Pepcid. 10/31/18 10/31/19  Nena Polio, MD    Allergies Enalapril and Zithromax [azithromycin]  History reviewed. No pertinent family history.  Social History Social History   Tobacco Use  . Smoking status: Current Every Day Smoker  Packs/day: 1.00    Types: Cigarettes  . Smokeless tobacco: Never Used  Substance Use Topics  . Alcohol use: Yes  . Drug use: Not on file    Review of Systems  Constitutional: No fever/chills Eyes: No visual changes. ENT: No sore throat. Cardiovascular: Denies chest pain. Respiratory: Denies shortness of breath. Gastrointestinal: No abdominal pain.  No nausea, no vomiting.  No diarrhea.  No constipation. Genitourinary: Negative for dysuria. Musculoskeletal: Negative for back pain. Skin: Negative for rash. Neurological: Negative for headaches, focal weakness    ____________________________________________   PHYSICAL EXAM:  VITAL SIGNS: ED Triage Vitals  Enc Vitals Group     BP 10/31/18 1234 (!) 153/83     Pulse Rate 10/31/18 1234 90     Resp 10/31/18 1234 18     Temp 10/31/18 1234 98.8 F (37.1 C)     Temp Source 10/31/18 1234 Oral     SpO2 10/31/18 1234 93 %     Weight 10/31/18 1235 268 lb (121.6 kg)     Height 10/31/18 1235 6\' 1"  (1.854 m)     Head Circumference --      Peak Flow --      Pain Score 10/31/18 1235 0     Pain Loc --      Pain Edu? --      Excl. in [place]? --     Constitutional: Alert and oriented. Well appearing and in no acute distress. Eyes: Conjunctivae are normal.  Head: Atraumatic. Nose: No congestion/rhinnorhea. Mouth/Throat: Mucous membranes are moist.  Oropharynx non-erythematous. Neck: No stridor.  Cardiovascular: Normal rate, regular rhythm. Grossly normal heart sounds.  Good peripheral circulation. Respiratory: Normal respiratory effort.  No retractions. Lungs slight expiratory wheezes Gastrointestinal: Soft and nontender. No distention. No abdominal bruits. No CVA tenderness. Musculoskeletal: No lower extremity tenderness nor edema.   Neurologic:  Normal speech and language. No gross focal neurologic deficits are appreciated.  Skin:  Skin is warm, dry and intact. No rash noted.   ____________________________________________   LABS (all labs ordered are listed, but only abnormal results are displayed)  Labs Reviewed - No data to display ____________________________________________  EKG   ____________________________________________  RADIOLOGY  ED MD interpretation:    Official radiology report(s): Dg Chest Portable 1 View  Result Date: 10/31/2018 CLINICAL DATA:  48 year old male with facial and lip swelling and low O2 sat. EXAM: PORTABLE CHEST 1 VIEW COMPARISON:  Chest radiograph dated 06/24/2018 FINDINGS: The heart size and mediastinal contours are within normal limits. Both lungs are clear.  The visualized skeletal structures are unremarkable. IMPRESSION: No active disease. Electronically Signed   By: Anner Crete M.D.   On: 10/31/2018 13:08    ____________________________________________   PROCEDURES  Procedure(s) performed (including Critical Care):  Procedures   ____________________________________________   INITIAL IMPRESSION / ASSESSMENT AND PLAN / ED COURSE  ----------------------------------------- 1:56 PM on 10/31/2018 -----------------------------------------  Patient with no change in his swelling.  Is not getting better or worse.    ----------------------------------------- 3:27 PM on 10/31/2018 -----------------------------------------  Swelling is now noticeably better.  Has been getting better since shortly after my last note.  We will let him go.  He knows when to return if worse and he will have medicines to take at home.          ____________________________________________   FINAL CLINICAL IMPRESSION(S) / ED DIAGNOSES  Final diagnoses:  Angioedema, initial encounter     ED Discharge Orders         Ordered  famotidine (PEPCID) 20 MG tablet  2 times daily     10/31/18 1526    predniSONE (DELTASONE) 20 MG tablet  Daily     10/31/18 1526           Note:  This document was prepared using Dragon voice recognition software and may include unintentional dictation errors.    Nena Polio, MD 10/31/18 [phone]

## 2018-10-31 NOTE — ED Notes (Signed)
Esgin not working and pt verbalized discharge instructions and has no questions at this time

## 2020-02-29 IMAGING — CR DG CHEST 2V
1 series · 2 of 2 positions shown · non-contrast
Comparison: 02/15/2017

CLINICAL DATA: Pre op. Pt states he is having lumbar sx on [REDACTED]
at 6 am. Smoker. Hx HtN

EXAM:
CHEST - 2 VIEW

[Series 1: dg chest 2 view · 0.14mm/px · 2 of 2 slices shown]
[im 1/2]
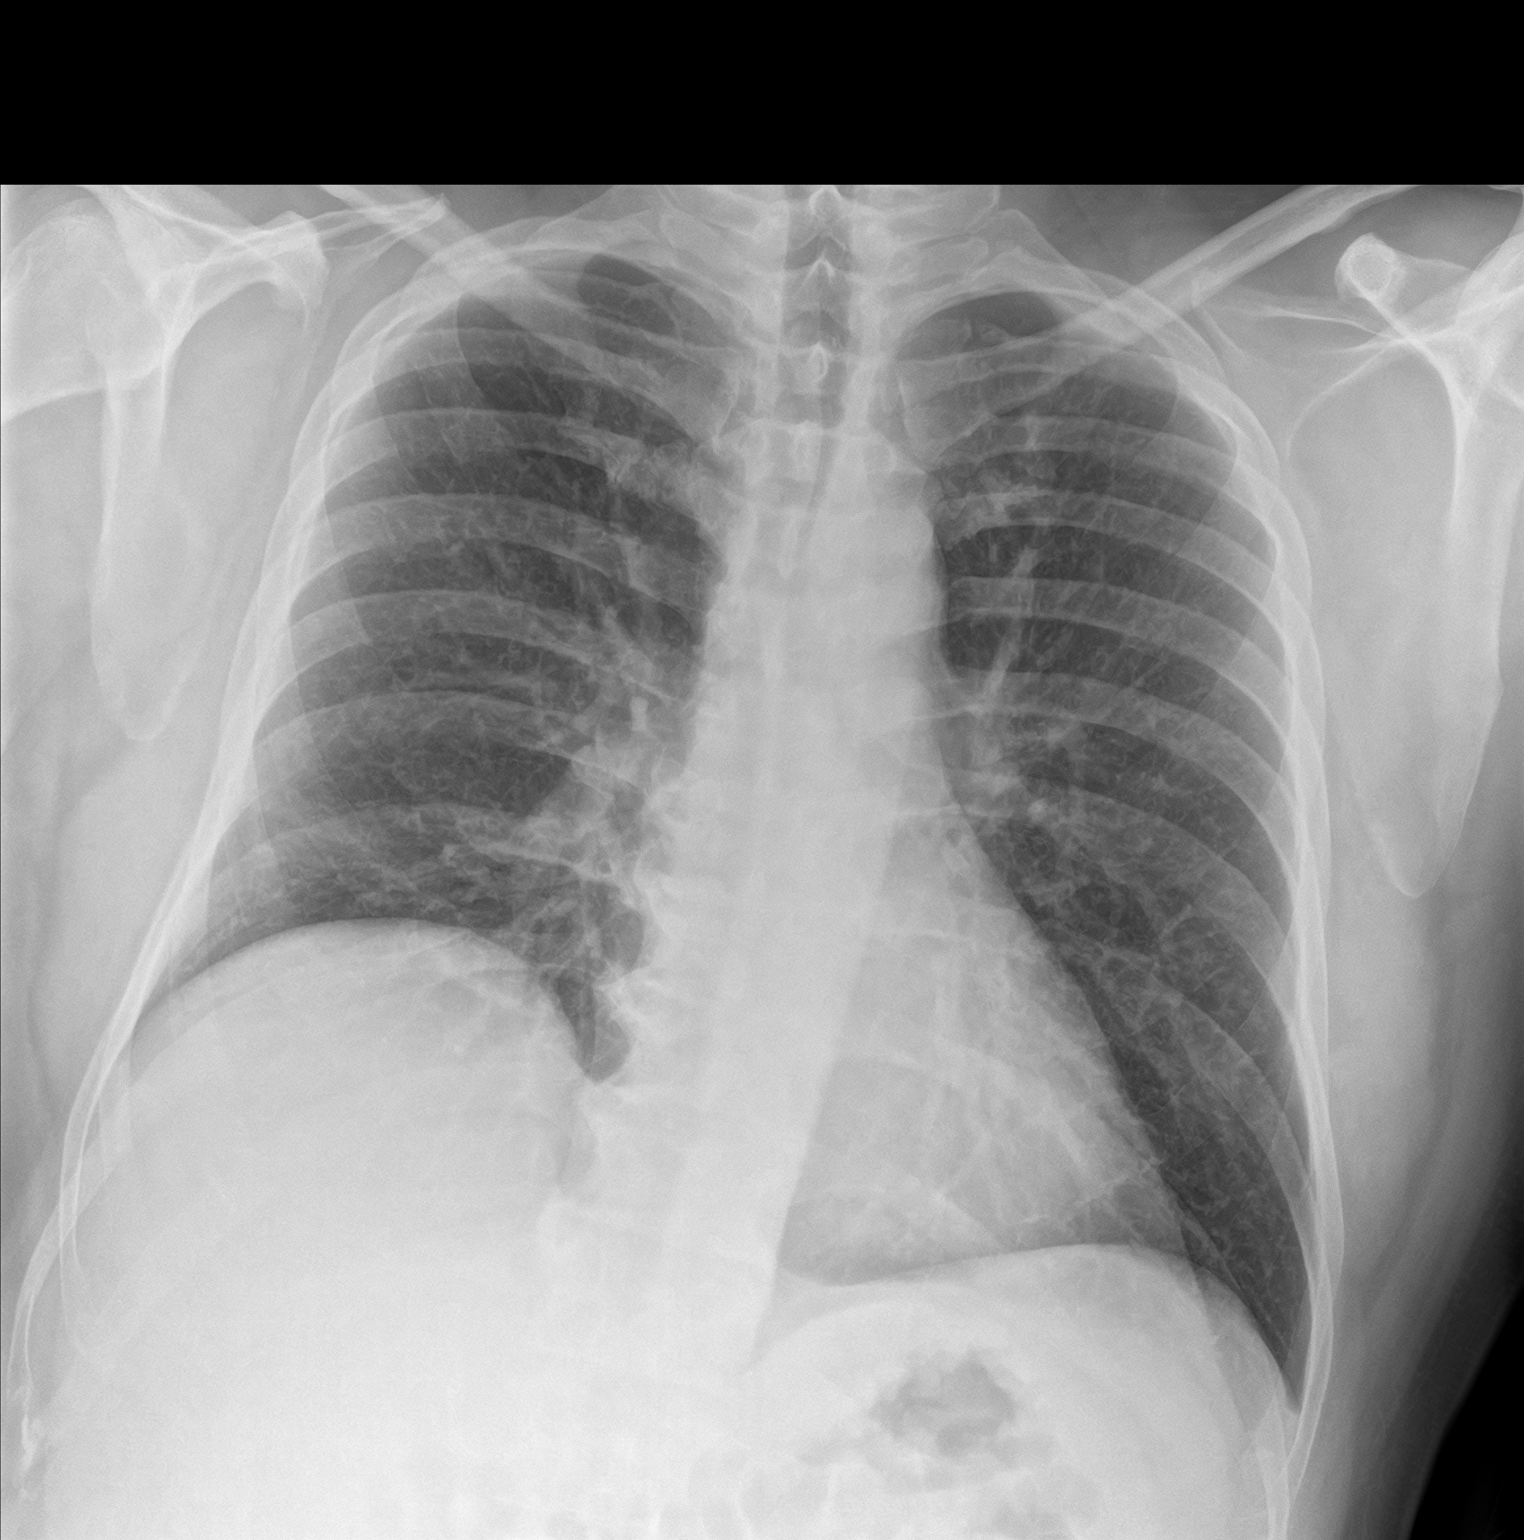
[im 2/2]
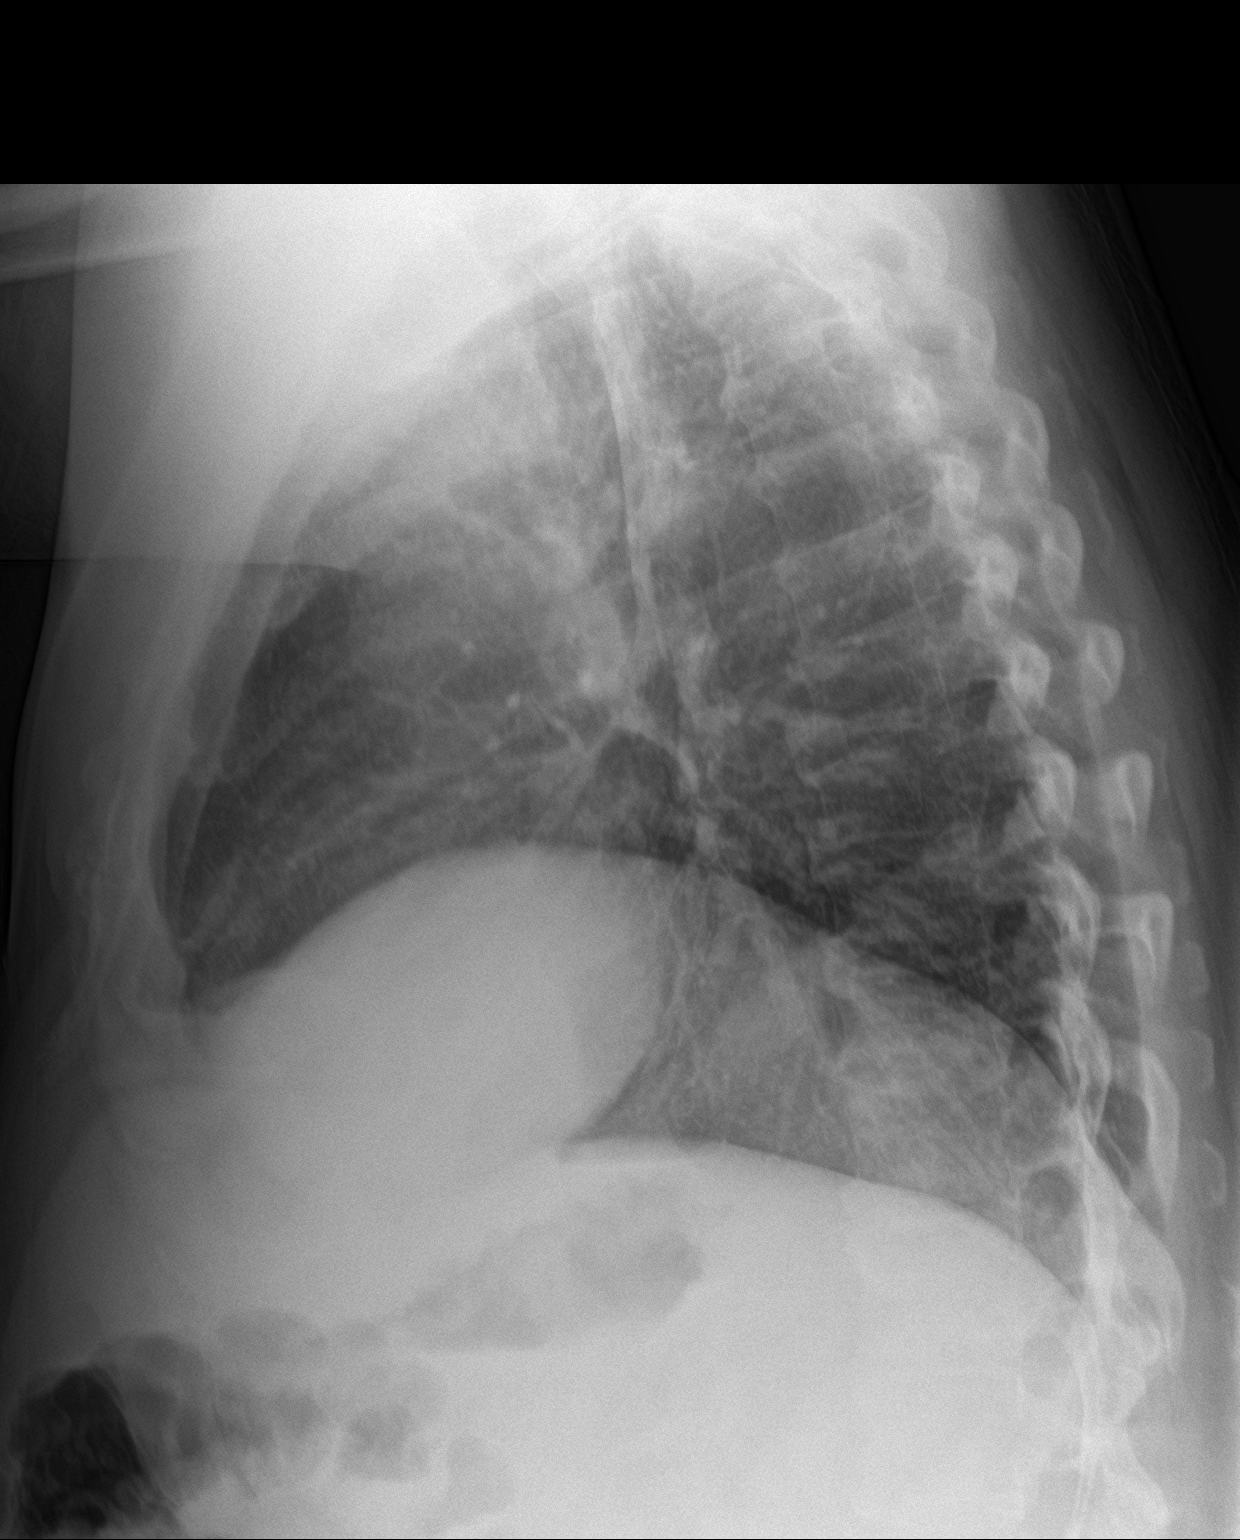

[2 of 2 positions shown; findings below may reference images not displayed]

FINDINGS: Lungs are clear. Elevation of the right diaphragmatic leaflet, new
since previous

Heart size and mediastinal contours are within normal limits.

No effusion.

Anterior vertebral endplate spurring at multiple levels in the mid
and lower thoracic spine.
IMPRESSION: 1. New elevation of the right diaphragmatic leaflet.
2. Lungs clear.

## 2020-03-03 IMAGING — RF DG LUMBAR SPINE 2-3V
1 series · 2 of 2 positions shown · non-contrast
Comparison: MRI 06/15/2018

CLINICAL DATA: Intraoperative localization

EXAM:
LUMBAR SPINE - 2-3 VIEW; DG C-ARM 61-120 MIN

[Series 1: unknown protocol · 0.14mm/px · 2 of 2 slices shown]
[im 1/2]
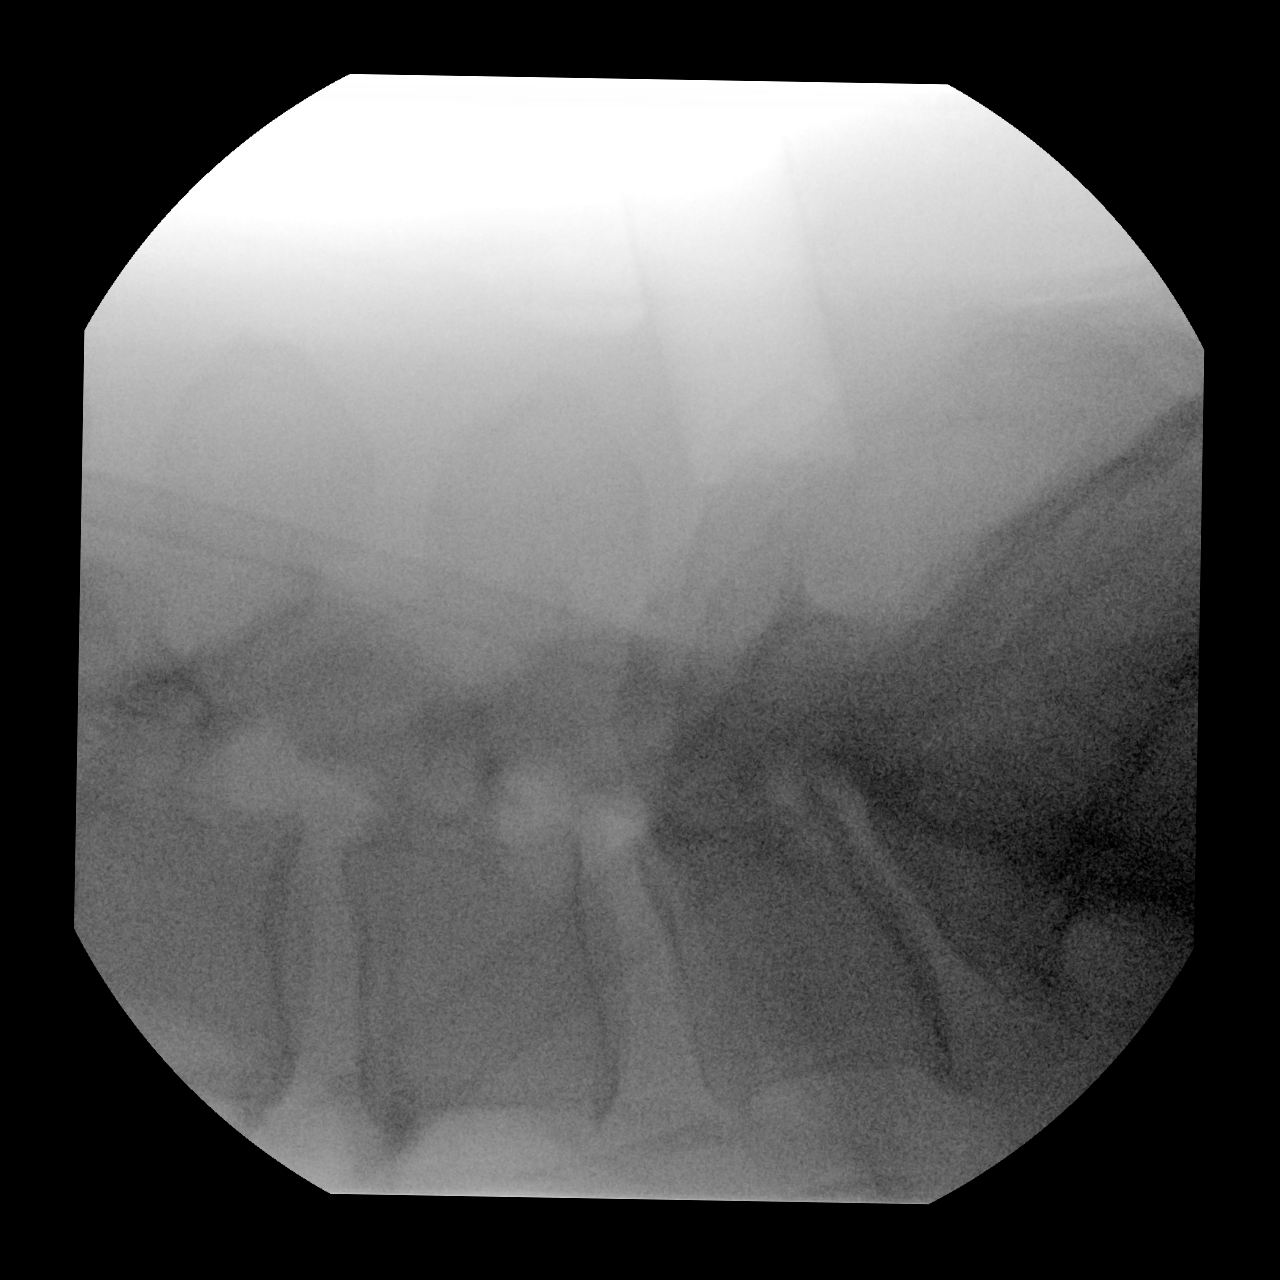
[im 2/2]
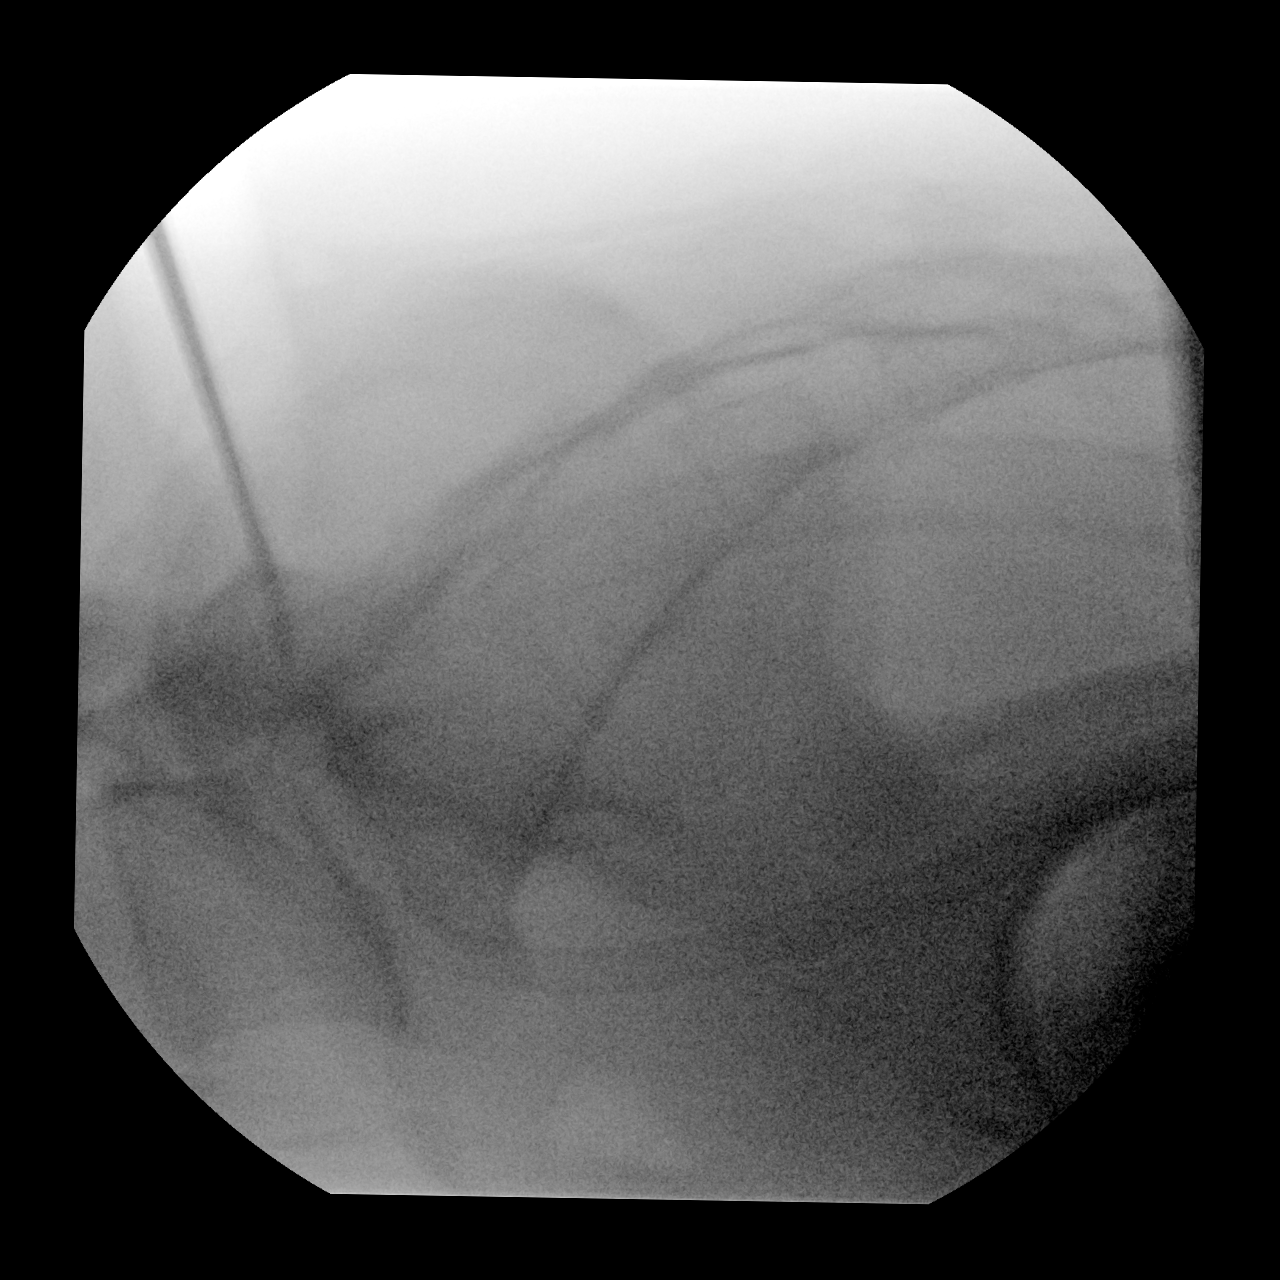

[2 of 2 positions shown; findings below may reference images not displayed]

FINDINGS: 2 intraoperative spot images demonstrate intraoperative localization
at L5-S1.
IMPRESSION: Intraoperative localization as above.

## 2020-05-20 ENCOUNTER — Ambulatory Visit (INDEPENDENT_AMBULATORY_CARE_PROVIDER_SITE_OTHER): Payer: Managed Care, Other (non HMO) | Admitting: Dermatology

## 2020-05-20 ENCOUNTER — Other Ambulatory Visit: Payer: Self-pay

## 2020-05-20 DIAGNOSIS — L918 Other hypertrophic disorders of the skin: Secondary | ICD-10-CM

## 2020-05-20 DIAGNOSIS — L578 Other skin changes due to chronic exposure to nonionizing radiation: Secondary | ICD-10-CM

## 2020-05-20 DIAGNOSIS — L700 Acne vulgaris: Secondary | ICD-10-CM

## 2020-05-20 DIAGNOSIS — D229 Melanocytic nevi, unspecified: Secondary | ICD-10-CM

## 2020-05-20 DIAGNOSIS — L280 Lichen simplex chronicus: Secondary | ICD-10-CM | POA: Diagnosis not present

## 2020-05-20 DIAGNOSIS — Z1283 Encounter for screening for malignant neoplasm of skin: Secondary | ICD-10-CM

## 2020-05-20 DIAGNOSIS — D225 Melanocytic nevi of trunk: Secondary | ICD-10-CM | POA: Diagnosis not present

## 2020-05-20 DIAGNOSIS — L720 Epidermal cyst: Secondary | ICD-10-CM

## 2020-05-20 DIAGNOSIS — L814 Other melanin hyperpigmentation: Secondary | ICD-10-CM

## 2020-05-20 DIAGNOSIS — L821 Other seborrheic keratosis: Secondary | ICD-10-CM

## 2020-05-20 DIAGNOSIS — D1800 Hemangioma unspecified site: Secondary | ICD-10-CM

## 2020-05-20 MED ORDER — CLOBETASOL PROPIONATE 0.05 % EX CREA: TOPICAL_CREAM | CUTANEOUS | 0 refills | Status: DC

## 2020-05-20 MED ORDER — ADAPALENE 0.3 % EX GEL: CUTANEOUS | 3 refills | Status: DC

## 2020-05-20 NOTE — Patient Instructions (Addendum)
Start Clobetasol cream twice a day to elbows until improved. Avoid face, groin, underams. Topical steroids (such as triamcinolone, fluocinolone, fluocinonide, mometasone, clobetasol, halobetasol, betamethasone, hydrocortisone) can cause thinning and lightening of the skin if they are used for too long in the same area. Your physician has selected the right strength medicine for your problem and area affected on the body. Please use your medication only as directed by your physician to prevent side effects.   Use moisturizer daily (AmLactin, CeraVe Psoriasis, CeraVe SA, Gold [employer] Rough and Bumpy).  Adapalene 0.3% gel - Apply a pea-sized amount to affected areas face every night as tolerated. Topical retinoid medications like tretinoin/Retin-A, adapalene/Differin, tazarotene/Fabior, and Epiduo/Epiduo Forte can cause dryness and irritation when first started. Only apply a pea-sized amount to the entire affected area. Avoid applying it around the eyes, edges of mouth and creases at the nose. If you experience irritation, use a good moisturizer first and/or apply the medicine less often. If you are doing well with the medicine, you can increase how often you use it until you are applying every night. Be careful with sun protection while using this medication as it can make you sensitive to the sun. This medicine should not be used by pregnant women.

## 2020-05-20 NOTE — Progress Notes (Signed)
Follow-Up Visit   Subjective  Tony Gibson is a 50 y.o. male who presents for the following: Annual Exam (UBSE. No hx of skin cancer of abnormal moles. He does have dry, itchy elbows >1 year.). No h/o psoriasis.  Knees are fine.  Denies repetitive pressure on elbows.   The following portions of the chart were reviewed this encounter and updated as appropriate:       Review of Systems:  No other skin or systemic complaints except as noted in HPI or Assessment and Plan.  Objective  Well appearing patient in no apparent distress; mood and affect are within normal limits.  All skin waist up examined.  Objective  Left mid Lower Back: 2.51mm medium dark brown macule  Objective  Right Inframammary: 8.9mm firm flesh nodule  Objective  bil elbows: Plaques BL elbows with severe xerosis, hyperkeratosis.  Objective  temples, medial cheeks: Open comedones.   Assessment & Plan   Skin cancer screening performed today.  Actinic Damage - chronic, secondary to cumulative UV radiation exposure/sun exposure over time - diffuse scaly erythematous macules with underlying dyspigmentation - Recommend daily broad spectrum sunscreen SPF 30+ to sun-exposed areas, reapply every 2 hours as needed.  - Call for new or changing lesions.  Lentigines - Scattered tan macules - Discussed due to sun exposure - Benign, observe - Recommend daily broad spectrum sunscreen SPF 30+ to sun-exposed areas, reapply every 2 hours as needed. - Call for any changes  Hemangiomas - Red papules, including soft violacoue papules on the upper sternum, L shoulder - Discussed benign nature - Observe - Call for any changes  Melanocytic Nevi - Tan-brown and/or pink-flesh-colored symmetric macules and papules - Benign appearing on exam today - Observation - Call clinic for new or changing moles - Recommend daily use of broad spectrum spf 30+ sunscreen to sun-exposed areas.   Seborrheic Keratoses -  Stuck-on, waxy, tan-brown papules and plaques  - Discussed benign etiology and prognosis. - Observe - Call for any changes  Acrochordons (Skin Tags) - Fleshy, skin-colored pedunculated papules - Benign appearing.  - Observe. - If desired, they can be removed with an in office procedure that is not covered by insurance. - Please call the clinic if you notice any new or changing lesions.  Nevus Left mid Lower Back  Benign-appearing.  Stable from previous visit. Observation.  Call clinic for new or changing moles.  Recommend daily use of broad spectrum spf 30+ sunscreen to sun-exposed areas.    Epidermal inclusion cyst Right Inframammary  Benign-appearing. Exam most consistent with an epidermal inclusion cyst. Discussed that a cyst is a benign growth that can grow over time and sometimes get irritated or inflamed. Recommend observation if it is not bothersome. Discussed option of surgical excision to remove it if it is growing, symptomatic, or other changes noted. Please call for new or changing lesions so they can be evaluated.    Lichen simplex chronicus bil elbows  /Chronic dermatitis vrs psoriasis Samples of AmLactin, CeraVe Psoriasis, CeraVe SA. Info given of [employer] and Bumpy.  Start clobetasol cream Apply to elbows qd/bid until itchy rash improved dsp 60g 0Rf. Avoid face, groin, axilla.  Topical steroids (such as triamcinolone, fluocinolone, fluocinonide, mometasone, clobetasol, halobetasol, betamethasone, hydrocortisone) can cause thinning and lightening of the skin if they are used for too long in the same area. Your physician has selected the right strength medicine for your problem and area affected on the body. Please use your medication only as  directed by your physician to prevent side effects.    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 Only apply a pea-sized amount to the entire affected area. Avoid applying it around the eyes, edges of mouth and creases at the nose. If you experience irritation, use a good moisturizer first and/or apply the medicine less often. If you are doing well with the medicine, you can increase how often you use it until you are applying every night. Be careful with sun protection while using this medication as it can make you sensitive to the sun. This medicine should not be used by pregnant women.   Discussed acne surgery if not improving with topical.  Adapalene 0.3 % gel - temples, medial cheeks  Return in about 1 year (around 05/20/2021) for UBSE, pt will RTC sooner if elbows don't improve.   IJamesetta Orleans, CMA, am acting as scribe for Brendolyn Patty, MD .  Documentation: I have reviewed the above documentation for accuracy and completeness, and I agree with the above.  Brendolyn Patty MD

## 2020-06-21 ENCOUNTER — Other Ambulatory Visit
Admission: RE | Admit: 2020-06-21 | Discharge: 2020-06-21 | Disposition: A | Discharge status: Home / Self Care | Payer: Managed Care, Other (non HMO) | Source: Ambulatory Visit | Attending: Gastroenterology | Admitting: Gastroenterology

## 2020-06-21 ENCOUNTER — Other Ambulatory Visit: Payer: Self-pay

## 2020-06-21 DIAGNOSIS — Z01812 Encounter for preprocedural laboratory examination: Secondary | ICD-10-CM | POA: Insufficient documentation

## 2020-06-21 DIAGNOSIS — Z20822 Contact with and (suspected) exposure to covid-19: Secondary | ICD-10-CM | POA: Diagnosis not present

## 2020-06-21 LAB — SARS CORONAVIRUS 2 (TAT 6-24 HRS): SARS Coronavirus 2: NEGATIVE

## 2020-06-24 ENCOUNTER — Encounter: Payer: Self-pay | Admitting: *Deleted

## 2020-06-25 ENCOUNTER — Encounter
Admission: RE | Disposition: A | Discharge status: Home / Self Care | Payer: Self-pay | Source: Home / Self Care | Attending: Gastroenterology

## 2020-06-25 ENCOUNTER — Other Ambulatory Visit: Payer: Self-pay

## 2020-06-25 ENCOUNTER — Encounter: Payer: Self-pay | Admitting: *Deleted

## 2020-06-25 ENCOUNTER — Ambulatory Visit: Payer: Managed Care, Other (non HMO) | Admitting: Registered Nurse

## 2020-06-25 ENCOUNTER — Ambulatory Visit
Admission: RE | Admit: 2020-06-25 | Discharge: 2020-06-25 | Disposition: A | Discharge status: Home / Self Care | Payer: Managed Care, Other (non HMO) | Attending: Gastroenterology | Admitting: Gastroenterology

## 2020-06-25 DIAGNOSIS — Z7951 Long term (current) use of inhaled steroids: Secondary | ICD-10-CM | POA: Insufficient documentation

## 2020-06-25 DIAGNOSIS — F17200 Nicotine dependence, unspecified, uncomplicated: Secondary | ICD-10-CM | POA: Diagnosis not present

## 2020-06-25 DIAGNOSIS — Z881 Allergy status to other antibiotic agents status: Secondary | ICD-10-CM | POA: Insufficient documentation

## 2020-06-25 DIAGNOSIS — Z888 Allergy status to other drugs, medicaments and biological substances status: Secondary | ICD-10-CM | POA: Insufficient documentation

## 2020-06-25 DIAGNOSIS — Z7982 Long term (current) use of aspirin: Secondary | ICD-10-CM | POA: Insufficient documentation

## 2020-06-25 DIAGNOSIS — Z1211 Encounter for screening for malignant neoplasm of colon: Secondary | ICD-10-CM | POA: Insufficient documentation

## 2020-06-25 DIAGNOSIS — I10 Essential (primary) hypertension: Secondary | ICD-10-CM | POA: Diagnosis not present

## 2020-06-25 DIAGNOSIS — Z79899 Other long term (current) drug therapy: Secondary | ICD-10-CM | POA: Diagnosis not present

## 2020-06-25 DIAGNOSIS — K640 First degree hemorrhoids: Secondary | ICD-10-CM | POA: Diagnosis not present

## 2020-06-25 DIAGNOSIS — D128 Benign neoplasm of rectum: Secondary | ICD-10-CM | POA: Insufficient documentation

## 2020-06-25 HISTORY — DX: Obesity, class 1: E66.811

## 2020-06-25 HISTORY — DX: Alcohol dependence, in remission: F10.21

## 2020-06-25 HISTORY — PX: COLONOSCOPY: SHX5424

## 2020-06-25 HISTORY — DX: Nicotine dependence, unspecified, uncomplicated: F17.200

## 2020-06-25 HISTORY — DX: Obesity, unspecified: E66.9

## 2020-06-25 SURGERY — COLONOSCOPY
Anesthesia: General

## 2020-06-25 MED ORDER — SODIUM CHLORIDE 0.9 % IV SOLN: INTRAVENOUS | Status: DC

## 2020-06-25 MED ORDER — PROPOFOL 10 MG/ML IV BOLUS
INTRAVENOUS | Status: DC | PRN
  Administered 2020-06-25: 80 mg via INTRAVENOUS
  Administered 2020-06-25: 20 mg via INTRAVENOUS

## 2020-06-25 MED ORDER — PROPOFOL 500 MG/50ML IV EMUL
INTRAVENOUS | Status: AC
  Filled 2020-06-25: qty 50

## 2020-06-25 MED ORDER — LIDOCAINE HCL (CARDIAC) PF 100 MG/5ML IV SOSY
PREFILLED_SYRINGE | INTRAVENOUS | Status: DC | PRN
  Administered 2020-06-25: 100 mg via INTRAVENOUS

## 2020-06-25 MED ORDER — LIDOCAINE HCL (PF) 2 % IJ SOLN
INTRAMUSCULAR | Status: AC
  Filled 2020-06-25: qty 5

## 2020-06-25 MED ORDER — PROPOFOL 500 MG/50ML IV EMUL
INTRAVENOUS | Status: DC | PRN
  Administered 2020-06-25: 150 ug/kg/min via INTRAVENOUS

## 2020-06-25 NOTE — Anesthesia Preprocedure Evaluation (Signed)
Anesthesia Evaluation  Patient identified by MRN, date of birth, ID band Patient awake    Reviewed: Allergy & Precautions, H&P , NPO status , Patient's Chart, lab work & pertinent test results  History of Anesthesia Complications Negative for: history of anesthetic complications  Airway Mallampati: III  TM Distance: >3 FB Neck ROM: full    Dental  (+) Chipped, Poor Dentition   Pulmonary neg shortness of breath, Current Smoker,    Pulmonary exam normal        Cardiovascular Exercise Tolerance: Good hypertension, (-) angina(-) Past MI Normal cardiovascular exam     Neuro/Psych PSYCHIATRIC DISORDERS negative neurological ROS     GI/Hepatic negative GI ROS, Neg liver ROS,   Endo/Other  negative endocrine ROS  Renal/GU negative Renal ROS  negative genitourinary   Musculoskeletal   Abdominal   Peds  Hematology negative hematology ROS (+)   Anesthesia Other Findings Past Medical History: No date: Anxiety No date: Depression No date: History of alcohol dependence (HCC) No date: HLD (hyperlipidemia) No date: Hypertension No date: Obesity (BMI 30.0-34.9) No date: Tobacco dependence  Past Surgical History: No date: APPENDECTOMY No date: ELBOW SURGERY 06/27/2018: LUMBAR LAMINECTOMY/DECOMPRESSION MICRODISCECTOMY; Left     Comment:  Procedure: LUMBAR HEMI LAMINECTOMY MICRODISCECTOMY 2               LEVELS LEFT L4/5, LEFT L5/S1;  Surgeon: Deetta Perla, MD;              Location: [facility] ORS;  Service: Neurosurgery;  Laterality:               Left;  BMI    Body Mass Index: 35.36 kg/m      Reproductive/Obstetrics negative OB ROS                             Anesthesia Physical Anesthesia Plan  ASA: III  Anesthesia Plan: General   Post-op Pain Management:    Induction: Intravenous  PONV Risk Score and Plan: Propofol infusion and TIVA  Airway Management Planned: Natural Airway and Nasal  Cannula  Additional Equipment:   Intra-op Plan:   Post-operative Plan:   Informed Consent: I have reviewed the patients History and Physical, chart, labs and discussed the procedure including the risks, benefits and alternatives for the proposed anesthesia with the patient or authorized representative who has indicated his/her understanding and acceptance.     Dental Advisory Given  Plan Discussed with: Anesthesiologist, CRNA and Surgeon  Anesthesia Plan Comments: (Patient consented for risks of anesthesia including but not limited to:  - adverse reactions to medications - risk of airway placement if required - damage to eyes, teeth, lips or other oral mucosa - nerve damage due to positioning  - sore throat or hoarseness - Damage to heart, brain, nerves, lungs, other parts of body or loss of life  Patient voiced understanding.)        Anesthesia Quick Evaluation

## 2020-06-25 NOTE — Transfer of Care (Signed)
Immediate Anesthesia Transfer of Care Note  Patient: Tony Gibson  Procedure(s) Performed: COLONOSCOPY (N/A )  Patient Location: PACU and Endoscopy Unit  Anesthesia Type:General  Level of Consciousness: awake and patient cooperative  Airway & Oxygen Therapy: Patient Spontanous Breathing  Post-op Assessment: Report given to RN and Post -op Vital signs reviewed and stable  Post vital signs: Reviewed and stable  Last Vitals:  Vitals Value Taken Time  BP 109/71 06/25/20 1111  Temp    Pulse 74 06/25/20 1111  Resp 26 06/25/20 1111  SpO2 93 % 06/25/20 1111    Last Pain:  Vitals:   06/25/20 1010  TempSrc: Temporal  PainSc: 0-No pain         Complications: No complications documented.

## 2020-06-25 NOTE — Op Note (Signed)
[facility] Patient Name: Tony Gibson Procedure Date: 06/25/2020 10:35 AM MRN: [id number] Account #: [id number] Date of Birth: January 29, 1971 Admit Type: Outpatient Age: 50 Room: [facility] ENDO ROOM 1 Gender: Male Note Status: Finalized Procedure:             Colonoscopy Indications:           Screening for colorectal malignant neoplasm Providers:             Andrey Farmer MD, MD Referring MD:          Dion Body (Referring MD) Medicines:             Monitored Anesthesia Care Complications:         No immediate complications. Estimated blood loss:                         Minimal. Procedure:             Pre-Anesthesia Assessment:                        - Prior to the procedure, a History and Physical was                         performed, and patient medications and allergies were                         reviewed. The patient is competent. The risks and                         benefits of the procedure and the sedation options and                         risks were discussed with the patient. All questions                         were answered and informed consent was obtained.                         Patient identification and proposed procedure were                         verified by the physician, the nurse, the anesthetist                         and the technician in the endoscopy suite. Mental                         Status Examination: alert and oriented. Airway                         Examination: normal oropharyngeal airway and neck                         mobility. Respiratory Examination: clear to                         auscultation. CV Examination: normal. Prophylactic                         Antibiotics:  The patient does not require prophylactic                         antibiotics. Prior Anticoagulants: The patient has                         taken no previous anticoagulant or antiplatelet                         agents. ASA  Grade Assessment: II - A patient with mild                         systemic disease. After reviewing the risks and                         benefits, the patient was deemed in satisfactory                         condition to undergo the procedure. The anesthesia                         plan was to use monitored anesthesia care (MAC).                         Immediately prior to administration of medications,                         the patient was re-assessed for adequacy to receive                         sedatives. The heart rate, respiratory rate, oxygen                         saturations, blood pressure, adequacy of pulmonary                         ventilation, and response to care were monitored                         throughout the procedure. The physical status of the                         patient was re-assessed after the procedure.                        After obtaining informed consent, the colonoscope was                         passed under direct vision. Throughout the procedure,                         the patient's blood pressure, pulse, and oxygen                         saturations were monitored continuously. The                         Colonoscope was introduced through the anus and  advanced to the the terminal ileum. The colonoscopy                         was performed without difficulty. The patient                         tolerated the procedure well. The quality of the bowel                         preparation was fair. Findings:      The perianal and digital rectal examinations were normal.      The terminal ileum appeared normal.      A 9 mm polyp was found in the rectum. The polyp was semi-pedunculated.       The polyp was removed with a hot snare. Resection and retrieval were       complete. Estimated blood loss was minimal.      Non-bleeding internal hemorrhoids were found during retroflexion. The       hemorrhoids were Grade I  (internal hemorrhoids that do not prolapse).      The exam was otherwise without abnormality on direct and retroflexion       views. Impression:            - Preparation of the colon was fair.                        - The examined portion of the ileum was normal.                        - One 9 mm polyp in the rectum, removed with a hot                         snare. Resected and retrieved.                        - Non-bleeding internal hemorrhoids.                        - The examination was otherwise normal on direct and                         retroflexion views. Recommendation:        - Discharge patient to home.                        - Resume previous diet.                        - Continue present medications.                        - Await pathology results.                        - Repeat colonoscopy in 1 year because the bowel                         preparation was suboptimal.                        - Return to  referring physician as previously                         scheduled. Procedure Code(s):     --- Professional ---                        [id number], Colonoscopy, flexible; with removal of                         tumor(s), polyp(s), or other lesion(s) by snare                         technique Diagnosis Code(s):     --- Professional ---                        Z12.11, Encounter for screening for malignant neoplasm                         of colon                        K64.0, First degree hemorrhoids                        K62.1, Rectal polyp CPT copyright 2019 [employer]. All rights reserved. The codes documented in this report are preliminary and upon coder review may  be revised to meet current compliance requirements. Andrey Farmer MD, MD 06/25/2020 11:07:47 AM Number of Addenda: 0 Note Initiated On: 06/25/2020 10:35 AM Scope Withdrawal Time: 0 hours 12 minutes 7 seconds  Total Procedure Duration: 0 hours 18 minutes 53 seconds  Estimated Blood Loss:   Estimated blood loss was minimal.      [facility]

## 2020-06-25 NOTE — Interval H&P Note (Signed)
History and Physical Interval Note:  06/25/2020 10:38 AM  Ruben Reason  has presented today for surgery, with the diagnosis of COLON CANCER SCREENING.  The various methods of treatment have been discussed with the patient and family. After consideration of risks, benefits and other options for treatment, the patient has consented to  Procedure(s): COLONOSCOPY (N/A) as a surgical intervention.  The patient's history has been reviewed, patient examined, no change in status, stable for surgery.  I have reviewed the patient's chart and labs.  Questions were answered to the patient's satisfaction.     Tony Gibson  Ok to proceed with colonoscopy

## 2020-06-25 NOTE — H&P (Signed)
Outpatient short stay form Pre-procedure 06/25/2020 10:35 AM Raylene Miyamoto MD, MPH  Primary Physician: Dr. Netty Starring  Reason for visit:  Screening  History of present illness:   50 y/o gentleman with history of alcohol abuse and hypertension here for index screening colonoscopy. No family history of GI malignancies. No blood thinners. History of appendectomy. No new GI symptoms such as rectal bleeding.    Current Facility-Administered Medications:  .  0.9 %  sodium chloride infusion, , Intravenous, Continuous, Locklear, Hilton Cork, MD, Last Rate: 20 mL/hr at 06/25/20 1020, New Bag at 06/25/20 1020  Medications Prior to Admission  Medication Sig Dispense Refill Last Dose  . Adapalene 0.3 % gel Apply a pea-sized amount to affected areas face every night as tolerated. 45 g 3 Past Week at Unknown time  . albuterol (VENTOLIN HFA) 108 (90 Base) MCG/ACT inhaler Inhale 2 puffs into the lungs every 4 (four) hours as needed for wheezing.   Past Week at Unknown time  . amLODipine (NORVASC) 10 MG tablet Take 10 mg by mouth daily.   06/25/2020 at 0700  . aspirin EC 81 MG tablet Take 81 mg by mouth daily. Swallow whole.   06/24/2020 at 0800  . busPIRone (BUSPAR) 5 MG tablet Take 5 mg by mouth 2 (two) times daily.   06/25/2020 at 0700  . carvedilol (COREG) 12.5 MG tablet Take 12.5 mg by mouth 2 (two) times daily with a meal.   06/25/2020 at 0700  . clobetasol cream (TEMOVATE) 0.05 % Apply to elbows 1-2 times a day until improved. Avoid face, groin, underarms. 60 g 0 Past Week at Unknown time  . Fluticasone-Salmeterol (ADVAIR) 100-50 MCG/DOSE AEPB Inhale 1 puff into the lungs 2 (two) times daily.   Past Week at Unknown time  . gabapentin (NEURONTIN) 300 MG capsule Take 300 mg by mouth at bedtime.    Past Month at Unknown time  . hydrochlorothiazide (HYDRODIURIL) 25 MG tablet Take 25 mg by mouth daily.   06/25/2020 at 0700  . lovastatin (MEVACOR) 10 MG tablet Take 10 mg by mouth at bedtime.   06/24/2020 at 1800  .  PARoxetine (PAXIL) 40 MG tablet Take 40 mg by mouth at bedtime.    06/24/2020 at 2000  . famotidine (PEPCID) 20 MG tablet Take 1 tablet (20 mg total) by mouth 2 (two) times daily. Take 1 pill once in the morning tomorrow. 1 tablet 0   . losartan (COZAAR) 100 MG tablet Take 100 mg by mouth daily.     . methocarbamol (ROBAXIN) 500 MG tablet Take 1 tablet (500 mg total) by mouth every 6 (six) hours as needed for muscle spasms. (Patient not taking: Reported on 06/25/2020) 60 tablet 0 Completed Course at 0800  . oxyCODONE (ROXICODONE) 5 MG immediate release tablet Take 1 tablet (5 mg total) by mouth every 4 (four) hours as needed for breakthrough pain. (Patient not taking: Reported on 06/25/2020) 30 tablet 0 Completed Course at Unknown time     Allergies  Allergen Reactions  . Enalapril Swelling    Throat, mouth, and feet swelling  . Zithromax [Azithromycin] Swelling    Facial swelling  . Losartan Potassium Swelling     Past Medical History:  Diagnosis Date  . Anxiety   . Depression   . History of alcohol dependence ([place])   . HLD (hyperlipidemia)   . Hypertension   . Obesity (BMI 30.0-34.9)   . Tobacco dependence     Review of systems:  Otherwise negative.  Physical Exam  Gen: Alert, oriented. Appears stated age.  HEENT: PERRLA. Lungs: No respiratory distress CV: RRR Abd: soft, benign, no masses Ext: No edema    Planned procedures: Proceed with colonoscopy. The patient understands the nature of the planned procedure, indications, risks, alternatives and potential complications including but not limited to bleeding, infection, perforation, damage to internal organs and possible oversedation/side effects from anesthesia. The patient agrees and gives consent to proceed.  Please refer to procedure notes for findings, recommendations and patient disposition/instructions.     Raylene Miyamoto MD, MPH Gastroenterology 06/25/2020  10:35 AM

## 2020-06-25 NOTE — Anesthesia Postprocedure Evaluation (Signed)
Anesthesia Post Note  Patient: Cordell Guercio  Procedure(s) Performed: COLONOSCOPY (N/A )  Patient location during evaluation: Endoscopy Anesthesia Type: General Level of consciousness: awake and alert Pain management: pain level controlled Vital Signs Assessment: post-procedure vital signs reviewed and stable Respiratory status: spontaneous breathing, nonlabored ventilation, respiratory function stable and patient connected to nasal cannula oxygen Cardiovascular status: blood pressure returned to baseline and stable Postop Assessment: no apparent nausea or vomiting Anesthetic complications: no   No complications documented.   Last Vitals:  Vitals:   06/25/20 1119 06/25/20 1129  BP: 121/77 119/82  Pulse: 75 66  Resp: 19 17  Temp:    SpO2: 95% 97%    Last Pain:  Vitals:   06/25/20 1129  TempSrc:   PainSc: 0-No pain                 Precious Haws Kamari Bilek

## 2020-06-26 LAB — SURGICAL PATHOLOGY

## 2020-06-27 ENCOUNTER — Encounter: Payer: Self-pay | Admitting: Gastroenterology

## 2020-07-07 IMAGING — DX PORTABLE CHEST - 1 VIEW
1 series · 1 of 1 positions shown · non-contrast
Comparison: Chest radiograph dated 06/24/2018

CLINICAL DATA: 48-year-old male with facial and lip swelling and
low O2 sat.

EXAM:
PORTABLE CHEST 1 VIEW

[chest ap]
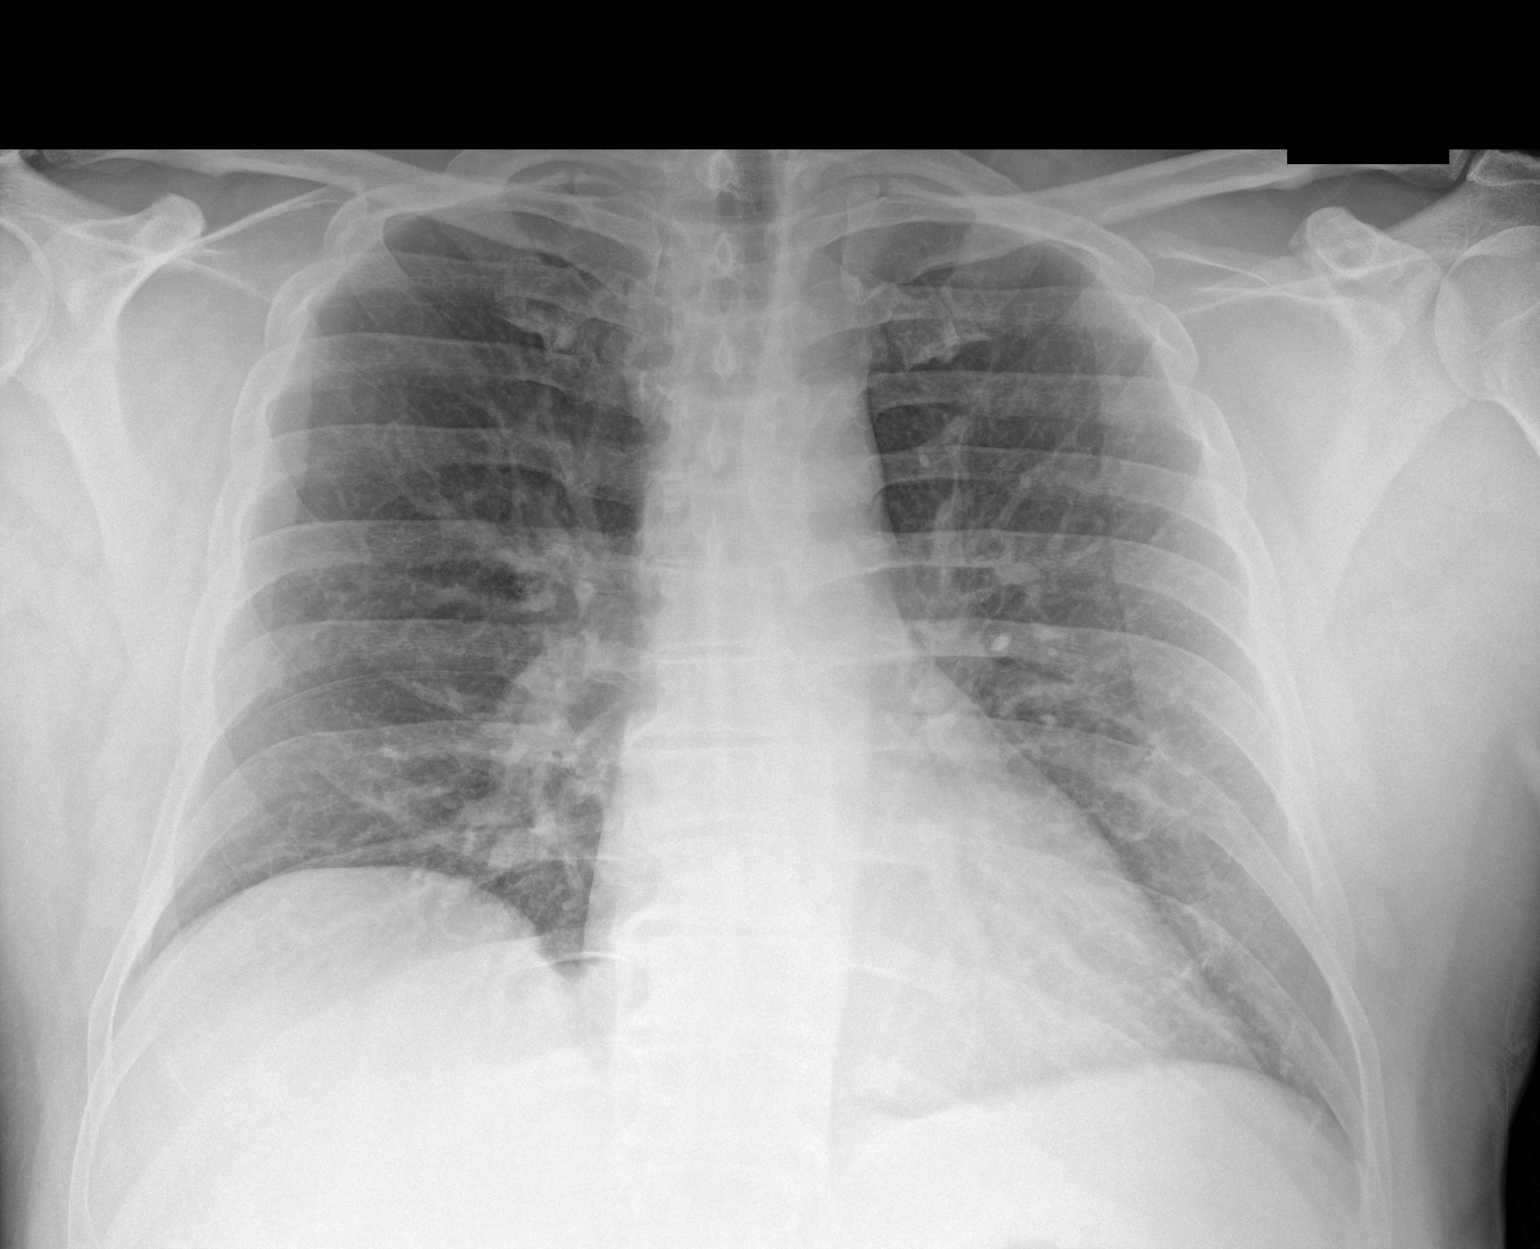

[1 of 1 positions shown; findings below may reference images not displayed]

FINDINGS: The heart size and mediastinal contours are within normal limits.
Both lungs are clear. The visualized skeletal structures are
unremarkable.
IMPRESSION: No active disease.

## 2020-08-27 ENCOUNTER — Other Ambulatory Visit: Payer: Self-pay | Admitting: Physical Medicine & Rehabilitation

## 2020-08-27 ENCOUNTER — Other Ambulatory Visit: Payer: Self-pay

## 2020-08-27 ENCOUNTER — Ambulatory Visit
Admission: RE | Admit: 2020-08-27 | Discharge: 2020-08-27 | Disposition: A | Discharge status: Home / Self Care | Payer: Managed Care, Other (non HMO) | Source: Ambulatory Visit | Attending: Physical Medicine & Rehabilitation | Admitting: Physical Medicine & Rehabilitation

## 2020-08-27 DIAGNOSIS — M5442 Lumbago with sciatica, left side: Secondary | ICD-10-CM | POA: Diagnosis present

## 2020-08-27 DIAGNOSIS — G8929 Other chronic pain: Secondary | ICD-10-CM | POA: Insufficient documentation

## 2020-08-27 MED ORDER — GADOBUTROL 1 MMOL/ML IV SOLN
10.0000 mL | Freq: Once | INTRAVENOUS | Status: AC | PRN
  Administered 2020-08-27: 10 mL via INTRAVENOUS

## 2021-05-26 ENCOUNTER — Encounter: Payer: Managed Care, Other (non HMO) | Admitting: Dermatology

## 2021-06-18 ENCOUNTER — Ambulatory Visit: Payer: Self-pay | Admitting: Dermatology

## 2021-07-02 ENCOUNTER — Other Ambulatory Visit: Payer: Self-pay

## 2021-07-02 ENCOUNTER — Encounter: Payer: Self-pay | Admitting: Dermatology

## 2021-07-02 ENCOUNTER — Ambulatory Visit (INDEPENDENT_AMBULATORY_CARE_PROVIDER_SITE_OTHER): Payer: BC Managed Care – PPO | Admitting: Dermatology

## 2021-07-02 DIAGNOSIS — L814 Other melanin hyperpigmentation: Secondary | ICD-10-CM

## 2021-07-02 DIAGNOSIS — L821 Other seborrheic keratosis: Secondary | ICD-10-CM

## 2021-07-02 DIAGNOSIS — L720 Epidermal cyst: Secondary | ICD-10-CM | POA: Diagnosis not present

## 2021-07-02 DIAGNOSIS — L578 Other skin changes due to chronic exposure to nonionizing radiation: Secondary | ICD-10-CM

## 2021-07-02 DIAGNOSIS — D1800 Hemangioma unspecified site: Secondary | ICD-10-CM

## 2021-07-02 DIAGNOSIS — D225 Melanocytic nevi of trunk: Secondary | ICD-10-CM | POA: Diagnosis not present

## 2021-07-02 DIAGNOSIS — L700 Acne vulgaris: Secondary | ICD-10-CM

## 2021-07-02 DIAGNOSIS — L280 Lichen simplex chronicus: Secondary | ICD-10-CM | POA: Diagnosis not present

## 2021-07-02 DIAGNOSIS — D229 Melanocytic nevi, unspecified: Secondary | ICD-10-CM

## 2021-07-02 MED ORDER — ADAPALENE 0.3 % EX GEL: CUTANEOUS | 3 refills | Status: DC

## 2021-07-02 NOTE — Progress Notes (Signed)
? ?Follow-Up Visit ?  ?Subjective  ?Tony Gibson is a 51 y.o. male who presents for the following: Annual Exam (No hx of skin cancer or DN). ? ?The patient presents for Upper Body Skin Exam (UBSE) for skin cancer screening and mole check.  The patient has spots, moles and lesions to be evaluated, some may be new or changing and the patient has concerns that these could be cancer. ? ? ?The following portions of the chart were reviewed this encounter and updated as appropriate:   ?  ? ?Review of Systems: No other skin or systemic complaints except as noted in HPI or Assessment and Plan. ? ? ?Objective  ?Well appearing patient in no apparent distress; mood and affect are within normal limits. ? ?All skin waist up examined. ? ?Left Mid Lower Back ?2.2m medium dark brown macule ? ?Right Inframammary ?1.2 cm firm subq nodule ? ?BL elbows ?Plaques BL elbows with severe xerosis, hyperkeratosis. ? ?temples and medial cheeks ?Scattered open/closed comedones and firm white papules consistent with milia ? ? ?Assessment & Plan  ? ?Lentigines ?- Scattered tan macules ?- Due to sun exposure ?- Benign-appearing, observe ?- Recommend daily broad spectrum sunscreen SPF 30+ to sun-exposed areas, reapply every 2 hours as needed. ?- Call for any changes ? ?Seborrheic Keratoses ?- Stuck-on, waxy, tan-brown papules and/or plaques  ?- Benign-appearing ?- Discussed benign etiology and prognosis. ?- Observe ?- Call for any changes ? ?Melanocytic Nevi ?- Tan-brown and/or pink-flesh-colored symmetric macules and papules ?- Benign appearing on exam today ?- Observation ?- Call clinic for new or changing moles ?- Recommend daily use of broad spectrum spf 30+ sunscreen to sun-exposed areas.  ? ?Hemangiomas ?- Red papules, sternum and left shoulder ?- Discussed benign nature ?- Observe ?- Call for any changes ? ?Actinic Damage ?- Chronic condition, secondary to cumulative UV/sun exposure ?- diffuse scaly erythematous macules with underlying  dyspigmentation ?- Recommend daily broad spectrum sunscreen SPF 30+ to sun-exposed areas, reapply every 2 hours as needed.  ?- Staying in the shade or wearing long sleeves, sun glasses (UVA+UVB protection) and wide brim hats (4-inch brim around the entire circumference of the hat) are also recommended for sun protection.  ?- Call for new or changing lesions. ? ?Skin cancer screening performed today. ? ?Nevus ?Left Mid Lower Back ? ?Benign-appearing.  Stable. Observation.  Call clinic for new or changing lesions.  Recommend daily use of broad spectrum spf 30+ sunscreen to sun-exposed areas.   ? ?Epidermal inclusion cyst ?Right Inframammary ? ?Benign-appearing.   ?Cyst with symptoms and/or recent change.  Discussed surgical excision to remove, including resulting scar and possible recurrence.  Patient will schedule for surgery. Pre-op information given.  ? ?Lichen simplex chronicus ?BL elbows ? ?LSC vrs psoriasis vrs severe xerosis, not itchy ? ?Recommend starting moisturizer with exfoliant (Urea, Salicylic acid, or Lactic acid) one to two times daily to help smooth rough and bumpy skin.  OTC options include Cetaphil Rough and Bumpy lotion (Urea), Eucerin Roughness Relief lotion or spot treatment cream (Urea), CeraVe SA lotion/cream for Rough and Bumpy skin (Sal Acid), Gold Bond Rough and Bumpy cream (Sal Acid), and AmLactin 12% lotion/cream (Lactic Acid).  If applying in morning, also apply sunscreen to sun-exposed areas, since these exfoliating moisturizers can increase sensitivity to sun. ? ? ?Related Medications ?clobetasol cream (TEMOVATE) 0.05 % ?Apply to elbows 1-2 times a day until improved. Avoid face, groin, underarms. ? 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 ?Start Differin 0.3% gel at bedtime. ? ?Topical retinoid medications like tretinoin/Retin-A, adapalene/Differin, tazarotene/Fabior, and Epiduo/Epiduo Forte can cause dryness and  irritation when first started. Only apply a pea-sized amount to the entire affected area. Avoid applying it around the eyes, edges of mouth and creases at the nose. If you experience irritation, use a good moisturizer first and/or apply the medicine less often. If you are doing well with the medicine, you can increase how often you use it until you are applying every night. Be careful with sun protection while using this medication as it can make you sensitive to the sun. This medicine should not be used by pregnant women.   ? ?Discussed acne surgery/milia extraction if not improving with topical. ? ?Related Medications ?Adapalene 0.3 % gel ?Apply a pea-sized amount to affected areas face every night as tolerated. ? ? ?Return in about 1 year (around 07/03/2022) for UBSE, also cyst excision next available. ? ?I, Emelia Salisbury, CMA, am acting as scribe for Brendolyn Patty, MD. ? ?Documentation: I have reviewed the above documentation for accuracy and completeness, and I agree with the above. ? ?Brendolyn Patty MD  ?

## 2021-07-02 NOTE — Patient Instructions (Addendum)
Elbows: Recommend starting moisturizer with exfoliant (Urea, Salicylic acid, or Lactic acid) one to two times daily to help smooth rough and bumpy skin.  OTC options include Cetaphil Rough and Bumpy lotion (Urea), Eucerin Roughness Relief lotion or spot treatment cream (Urea), CeraVe SA lotion/cream for Rough and Bumpy skin (Sal Acid), Gold Bond Rough and Bumpy cream (Sal Acid), and AmLactin 12% lotion/cream (Lactic Acid).  If applying in morning, also apply sunscreen to sun-exposed areas, since these exfoliating moisturizers can increase sensitivity to sun.  Face: Start Differin 0.3% gel at bedtime to face, wash off in morning.   Topical retinoid medications like tretinoin/Retin-A, adapalene/Differin, tazarotene/Fabior, and Epiduo/Epiduo Forte can cause dryness and irritation when first started. Only apply a pea-sized amount to the entire affected area. Avoid applying it around the eyes, edges of mouth and creases at the nose. If you experience irritation, use a good moisturizer first and/or apply the medicine less often. If you are doing well with the medicine, you can increase how often you use it until you are applying every night. Be careful with sun protection while using this medication as it can make you sensitive to the sun. This medicine should not be used by pregnant women.      Pre-Operative Instructions  You are scheduled for a surgical procedure at [facility]. We recommend you read the following instructions. If you have any questions or concerns, please call the office at [phone].  Shower and wash the entire body with soap and water the day of your surgery paying special attention to cleansing at and around the planned surgery site.  Avoid aspirin or aspirin containing products at least fourteen (14) days prior to your surgical procedure and for at least one week (7 Days) after your surgical procedure. If you take aspirin on a regular basis for heart disease or history  of stroke or for any other reason, we may recommend you continue taking aspirin but please notify us if you take this on a regular basis. Aspirin can cause more bleeding to occur during surgery as well as prolonged bleeding and bruising after surgery.   Avoid other nonsteroidal pain medications at least one week prior to surgery and at least one week prior to your surgery. These include medications such as Ibuprofen (Motrin, Advil and Nuprin), Naprosyn, Voltaren, Relafen, etc. If medications are used for therapeutic reasons, please inform us as they can cause increased bleeding or prolonged bleeding during and bruising after surgical procedures.   Please advise [place] if you are taking any "blood thinner" medications such as Coumadin or Dipyridamole or Plavix or similar medications. These cause increased bleeding and prolonged bleeding during procedures and bruising after surgical procedures. We may have to consider discontinuing these medications briefly prior to and shortly after your surgery if safe to do so.   Please inform us of all medications you are currently taking. All medications that are taken regularly should be taken the day of surgery as you always do. Nevertheless, we need to be informed of what medications you are taking prior to surgery to know whether they will affect the procedure or cause any complications.   Please inform us of any medication allergies. Also inform us of whether you have allergies to Latex or rubber products or whether you have had any adverse reaction to Lidocaine or Epinephrine.  Please inform us of any prosthetic or artificial body parts such as artificial heart valve, joint replacements, etc., or similar condition that might require preoperative antibiotics.  We recommend avoidance of alcohol at least two weeks prior to surgery and continued avoidance for at least two weeks after surgery.   We recommend discontinuation of tobacco smoking at least two weeks prior  to surgery and continued abstinence for at least two weeks after surgery.  Do not plan strenuous exercise, strenuous work or strenuous lifting for approximately four weeks after your surgery.   We request if you are unable to make your scheduled surgical appointment, please call us at least a week in advance or as soon as you are aware of a problem so that we can cancel or reschedule the appointment.   You MAY TAKE TYLENOL (acetaminophen) for pain as it is not a blood thinner.   PLEASE PLAN TO BE IN TOWN FOR TWO WEEKS FOLLOWING SURGERY, THIS IS IMPORTANT SO YOU CAN BE CHECKED FOR DRESSING CHANGES, SUTURE REMOVAL AND TO MONITOR FOR POSSIBLE COMPLICATIONS.   Melanoma ABCDEs  Melanoma is the most dangerous type of skin cancer, and is the leading cause of death from skin disease.  You are more likely to develop melanoma if you: Have light-colored skin, light-colored eyes, or red or blond hair Spend a lot of time in the sun Tan regularly, either outdoors or in a tanning bed Have had blistering sunburns, especially during childhood Have a close family member who has had a melanoma Have atypical moles or large birthmarks  Early detection of melanoma is key since treatment is typically straightforward and cure rates are extremely high if we catch it early.   The first sign of melanoma is often a change in a mole or a new dark spot.  The ABCDE system is a way of remembering the signs of melanoma.  A for asymmetry:  The two halves do not match. B for border:  The edges of the growth are irregular. C for color:  A mixture of colors are present instead of an even brown color. D for diameter:  Melanomas are usually (but not always) greater than 57m - the size of a pencil eraser. E for evolution:  The spot keeps changing in size, shape, and color.  Please check your skin once per month between visits. You can use a small mirror in front and a large mirror behind you to keep an eye on the back side or  your body.   If you see any new or changing lesions before your next follow-up, please call to schedule a visit.  Please continue daily skin protection including broad spectrum sunscreen SPF 30+ to sun-exposed areas, reapplying every 2 hours as needed when you're outdoors.   Staying in the shade or wearing long sleeves, sun glasses (UVA+UVB protection) and wide brim hats (4-inch brim around the entire circumference of the hat) are also recommended for sun protection.     If You Need Anything After Your Visit  If you have any questions or concerns for your doctor, please call our main line at [phone] press option 4 to reach your doctor's medical assistant. If no one answers, please leave a voicemail as directed and we will return your call as soon as possible. Messages left after 4 pm will be answered the following business day.   You may also send [place] message via [place] We typically respond to MyChart messages within 1-2 business days.  For prescription refills, please ask your pharmacy to contact our office. Our fax number is [phone]  If you have an urgent issue when the clinic is closed  that cannot wait until the next business day, you can page your doctor at the number below.    Please note that while we do our best to be available for urgent issues outside of office hours, we are not available 24/7.   If you have an urgent issue and are unable to reach [place], you may choose to seek medical care at your doctor's office, retail clinic, urgent care center, or emergency room.  If you have a medical emergency, please immediately call 911 or go to the emergency department.  Pager Numbers  - Dr. Nehemiah Massed: [phone]  - Dr. Laurence Ferrari: [phone]  - Dr. Nicole Kindred: [phone]  In the event of inclement weather, please call our main line at [phone] for an update on the status of any delays or closures.  Dermatology Medication Tips: Please keep the boxes that topical  medications come in in order to help keep track of the instructions about where and how to use these. Pharmacies typically print the medication instructions only on the boxes and not directly on the medication tubes.   If your medication is too expensive, please contact our office at [phone] option 4 or send [place] a message through [place].   We are unable to tell what your co-pay for medications will be in advance as this is different depending on your insurance coverage. However, we may be able to find a substitute medication at lower cost or fill out paperwork to get insurance to cover a needed medication.   If a prior authorization is required to get your medication covered by your insurance company, please allow [place] 1-2 business days to complete this process.  Drug prices often vary depending on where the prescription is filled and some pharmacies may offer cheaper prices.  The website www.goodrx.com contains coupons for medications through different pharmacies. The prices here do not account for what the cost may be with help from insurance (it may be cheaper with your insurance), but the website can give you the price if you did not use any insurance.  - You can print the associated coupon and take it with your prescription to the pharmacy.  - You may also stop by our office during regular business hours and pick up a GoodRx coupon card.  - If you need your prescription sent electronically to a different pharmacy, notify our office through [facility] or by phone at [phone] option 4.     Si Usted Necesita Algo Despus de Su Visita  Tambin puede enviarnos un mensaje a travs de Pharmacist, community. Por lo general respondemos a los mensajes de MyChart en el transcurso de 1 a 2 das hbiles.  Para renovar recetas, por favor pida a su farmacia que se ponga en contacto con nuestra oficina. Harland Dingwall de fax es [place] [phone].  Si tiene un asunto urgente cuando la clnica est  cerrada y que no puede esperar hasta el siguiente da hbil, puede llamar/localizar a su doctor(a) al nmero que aparece a continuacin.   Por favor, tenga en cuenta que aunque hacemos todo lo posible para estar disponibles para asuntos urgentes fuera del horario de [place], no estamos disponibles las 24 horas del da, los 7 das de la [place].   Si tiene un problema urgente y no puede comunicarse con nosotros, puede optar por buscar atencin mdica  en el consultorio de su doctor(a), en una clnica privada, en un centro de atencin urgente o en una sala de emergencias.  Si tiene [employer], por  favor llame inmediatamente al 911 o vaya a la sala de emergencias.  Nmeros de bper  - Dr. Nehemiah Massed: [phone]  - Dra. Moye: [phone]  - Dra. Nicole Kindred: [phone]  En caso de inclemencias del [place], por favor llame a Johnsie Kindred principal al [phone] para una actualizacin sobre el [place] de cualquier retraso o cierre.  Consejos para la medicacin en dermatologa: Por favor, guarde las cajas en las que vienen los medicamentos de uso tpico para ayudarle a seguir las instrucciones sobre dnde y cmo usarlos. Las farmacias generalmente imprimen las instrucciones del medicamento slo en las cajas y no directamente en los tubos del [place].   Si su medicamento es muy caro, por favor, pngase en contacto con Zigmund Daniel llamando al [phone] y presione la opcin 4 o envenos un mensaje a travs de Pharmacist, community.   No podemos decirle cul ser su copago por los medicamentos por adelantado ya que esto es diferente dependiendo de la cobertura de su seguro. Sin embargo, es posible que podamos encontrar un medicamento sustituto a Electrical engineer un formulario para que el seguro cubra el medicamento que se considera necesario.   Si se requiere una autorizacin previa para que su compaa de seguros [place] su medicamento, por favor permtanos de 1 a 2 das hbiles para  completar este proceso.  Los precios de los medicamentos varan con frecuencia dependiendo del Environmental consultant de dnde se surte la receta y alguna farmacias pueden ofrecer precios ms baratos.  El sitio web www.goodrx.com tiene cupones para medicamentos de Airline pilot. Los precios aqu no tienen en cuenta lo que podra costar con la ayuda del seguro (puede ser ms barato con su seguro), pero el sitio web puede darle el precio si no utiliz Research scientist (physical sciences).  - Puede imprimir el cupn correspondiente y llevarlo con su receta a la farmacia.  - Tambin puede pasar por nuestra oficina durante el horario de atencin regular y Charity fundraiser una tarjeta de cupones de GoodRx.  - Si necesita que su receta se enve electrnicamente a una farmacia diferente, informe a nuestra oficina a travs de MyChart de [place] o por telfono llamando al [phone] y presione la opcin 4.

## 2021-07-07 ENCOUNTER — Telehealth: Payer: Self-pay

## 2021-07-07 MED ORDER — TRETINOIN 0.05 % EX CREA: TOPICAL_CREAM | Freq: Every day | CUTANEOUS | 2 refills | Status: DC

## 2021-07-07 NOTE — Telephone Encounter (Signed)
ERROR

## 2021-07-07 NOTE — Addendum Note (Signed)
Addended by: Johnsie Kindred R on: 07/07/2021 02:02 PM ? ? Modules accepted: Orders ? ?

## 2021-07-07 NOTE — Telephone Encounter (Signed)
Patient advised of medication change. AW to send in Rx. ?

## 2021-07-07 NOTE — Telephone Encounter (Signed)
Patients Adapalene 0.3% gel is denied. Medication is not on patients formulary. Patient must have tried and failed, or unable to try ALL formulary alternatives. Alternatives include: Generic tretinoin, generic tazarotene 0.1% cream, generic clindamycin gel/lotion, generic azelaic acid 15% gel.  ?(Tazarotene cream requires generic tretinoin be tried before it will be approved) ?Please advise.  ?

## 2021-08-11 ENCOUNTER — Ambulatory Visit: Payer: BC Managed Care – PPO | Admitting: Dermatology

## 2021-08-11 DIAGNOSIS — L720 Epidermal cyst: Secondary | ICD-10-CM | POA: Diagnosis not present

## 2021-08-11 DIAGNOSIS — D492 Neoplasm of unspecified behavior of bone, soft tissue, and skin: Secondary | ICD-10-CM

## 2021-08-11 NOTE — Patient Instructions (Addendum)
Wound Care Instructions ? ?On the day following your surgery, you should begin doing daily dressing changes: ?Remove the old dressing and discard it. ?Cleanse the wound gently with tap water. This may be done in the shower or by placing a wet gauze pad directly on the wound and letting it soak for several minutes. ?It is important to gently remove any dried blood from the wound in order to encourage healing. This may be done by gently rolling a moistened Q-tip on the dried blood. Do not pick at the wound. ?If the wound should start to bleed, continue cleaning the wound, then place a moist gauze pad on the wound and hold pressure for a few minutes.  ?Make sure you then dry the skin surrounding the wound completely or the tape will not stick to the skin. Do not use cotton balls on the wound. ?After the wound is clean and dry, apply the ointment gently with a Q-tip. ?Cut a non-stick pad to fit the size of the wound. Lay the pad flush to the wound. If the wound is draining, you may want to reinforce it with a small amount of gauze on top of the non-stick pad for a little added compression to the area. ?Use the tape to seal the area completely. ?Select from the following with respect to your individual situation: ?If your wound has been stitched closed: continue the above steps 1-8 at least daily until your sutures are removed. ?If your wound has been left open to heal: continue steps 1-8 at least daily for the first 3-4 weeks. ?We would like for you to take a few extra precautions for at least the next week. ?Sleep with your head elevated on pillows if our wound is on your head. ?Do not bend over or lift heavy items to reduce the chance of elevated blood pressure to the wound ?Do not participate in particularly strenuous activities. ? ? ?Below is a list of dressing supplies you might need.  ?Cotton-tipped applicators - Q-tips ?Gauze pads (2x2 and/or 4x4) - All-Purpose Sponges ?Non-stick dressing material - Telfa ?Tape -  Paper or Hypafix ?New and clean tube of petroleum jelly - Vaseline  ? ? ?Comments on Post-Operative Period ?Slight swelling and redness often appear around the wound. This is normal and will disappear within several days following the surgery. ?The healing wound will drain a brownish-red-yellow discharge during healing. This is a normal phase of wound healing. As the wound begins to heal, the drainage may increase in amount. Again, this drainage is normal. ?Notify us if the drainage becomes persistently bloody, excessively swollen, or intensely painful or develops a foul odor or red streaks.  ?If you should experience mild discomfort during the healing phase, you may take an aspirin-free medication such as Tylenol (acetaminophen). Notify us if the discomfort is severe or persistent. Avoid alcoholic beverages when taking pain medicine. ? ?In Case of Wound Hemorrhage ?A wound hemorrhage is when the bandage suddenly becomes soaked with bright red blood and flows profusely. If this happens, sit down or lie down with your head elevated. If the wound has a dressing on it, do not remove the dressing. Apply pressure to the existing gauze. If the wound is not covered, use a gauze pad to apply pressure and continue applying the pressure for 20 minutes without peeking. DO NOT COVER THE WOUND WITH A LARGE TOWEL OR WASH CLOTH. Release your hand from the wound site but do not remove the dressing. If the bleeding has stopped,   gently clean around the wound. Leave the dressing in place for 24 hours if possible. This wait time allows the blood vessels to close off so that you do not spark a new round of bleeding by disrupting the newly clotted blood vessels with an immediate dressing change. If the bleeding does not subside, continue to hold pressure. If matters are out of your control, contact an After Hours clinic or go to the Emergency Room. ? ? ? ? ? ? ? ?If You Need Anything After Your Visit ? ?If you have any questions or  concerns for your doctor, please call our main line at [phone] and press option 4 to reach your doctor's medical assistant. If no one answers, please leave a voicemail as directed and we will return your call as soon as possible. Messages left after 4 pm will be answered the following business day.  ? ?You may also send [place] a message via MyChart. We typically respond to MyChart messages within 1-2 business days. ? ?For prescription refills, please ask your pharmacy to contact our office. Our fax number is [phone]. ? ?If you have an urgent issue when the clinic is closed that cannot wait until the next business day, you can page your doctor at the number below.   ? ?Please note that while we do our best to be available for urgent issues outside of office hours, we are not available 24/7.  ? ?If you have an urgent issue and are unable to reach [place], you may choose to seek medical care at your doctor's office, retail clinic, urgent care center, or emergency room. ? ?If you have a medical emergency, please immediately call 911 or go to the emergency department. ? ?Pager Numbers ? ?- Dr. Nehemiah Massed: [phone] ? ?- Dr. Laurence Ferrari: [phone] ? ?- Dr. Nicole Kindred: [phone] ? ?In the event of inclement weather, please call our main line at [phone] for an update on the status of any delays or closures. ? ?Dermatology Medication Tips: ?Please keep the boxes that topical medications come in in order to help keep track of the instructions about where and how to use these. Pharmacies typically print the medication instructions only on the boxes and not directly on the medication tubes.  ? ?If your medication is too expensive, please contact our office at [phone] option 4 or send [place] a message through [place].  ? ?We are unable to tell what your co-pay for medications will be in advance as this is different depending on your insurance coverage. However, we may be able to find a substitute medication at lower cost or  fill out paperwork to get insurance to cover a needed medication.  ? ?If a prior authorization is required to get your medication covered by your insurance company, please allow [place] 1-2 business days to complete this process. ? ?Drug prices often vary depending on where the prescription is filled and some pharmacies may offer cheaper prices. ? ?The website www.goodrx.com contains coupons for medications through different pharmacies. The prices here do not account for what the cost may be with help from insurance (it may be cheaper with your insurance), but the website can give you the price if you did not use any insurance.  ?- You can print the associated coupon and take it with your prescription to the pharmacy.  ?- You may also stop by our office during regular business hours and pick up a GoodRx coupon card.  ?- If you need your prescription sent electronically to a  different pharmacy, notify our office through [facility] or by phone at [phone] option 4. ? ? ? ? ?Si Usted Necesita Algo Despu?s de Su Visita ? ?Tambi?n puede enviarnos un mensaje a trav?s de MyChart. Por lo general respondemos a los mensajes de MyChart en el transcurso de 1 a 2 d?as h?biles. ? ?Para renovar recetas, por favor pida a su farmacia que se ponga en contacto con nuestra oficina. Nuestro n?mero de fax es el [phone]. ? ?Si tiene un asunto urgente cuando la cl?nica est? cerrada y que no puede esperar hasta el siguiente d?a h?bil, puede llamar/localizar a su doctor(a) al n?mero que aparece a continuaci?n.  ? ?Por favor, tenga en cuenta que aunque hacemos todo lo posible para estar disponibles para asuntos urgentes fuera del horario de oficina, no estamos disponibles las 24 horas del d?a, los 7 d?as de la semana.  ? ?Si tiene un problema urgente y no puede comunicarse con nosotros, puede optar por buscar atenci?n m?dica  en el consultorio de su doctor(a), en una cl?nica privada, en un centro de atenci?n urgente o en una sala  de emergencias. ? ?Si tiene Engineer, maintenance (IT) m?dica, por favor llame inmediatamente al 911 o vaya a la sala de emergencias. ? ?N?meros de b?per ? ?- Dr. Nehemiah Massed: [phone] ? ?- Dra. Moye: [phone]

## 2021-08-11 NOTE — Progress Notes (Signed)
? ?  Follow-Up Visit ?  ?Subjective  ?Tony Gibson is a 51 y.o. male who presents for the following: Cyst (R inframmary, pt presents for excision). ? ? ? ?The following portions of the chart were reviewed this encounter and updated as appropriate:  ?  ?  ? ?Review of Systems:  No other skin or systemic complaints except as noted in HPI or Assessment and Plan. ? ?Objective  ?Well appearing patient in no apparent distress; mood and affect are within normal limits. ? ?A focused examination was performed including trunk. Relevant physical exam findings are noted in the Assessment and Plan. ? ?Right Inframammary ?Firm white SQ nodule 1.4 cm ? ? ? ?Assessment & Plan  ?Neoplasm of skin ?Right Inframammary ? ?Skin excision ? ?Lesion length (cm):  1.4 ?Lesion width (cm):  1.4 ?Margin per side (cm):  0.1 ?Total excision diameter (cm):  1.6 ?Informed consent: discussed and consent obtained   ?Timeout: patient name, date of birth, surgical site, and procedure verified   ?Procedure prep:  Patient was prepped and draped in usual sterile fashion ?Prep type:  Povidone-iodine ?Anesthesia: the lesion was anesthetized in a standard fashion   ?Anesthetic:  1% lidocaine w/ epinephrine 1-100,000 buffered w/ 8.4% NaHCO3 (6cc lido w/ epi, 6cc bupivicaine, Total of 12cc) ?Instrument used comment:  #15c blade ?Hemostasis achieved with: pressure and electrodesiccation   ?Outcome: patient tolerated procedure well with no complications   ? ?Skin repair ?Complexity:  Intermediate ?Final length (cm):  1.5 ?Informed consent: discussed and consent obtained   ?Timeout: patient name, date of birth, surgical site, and procedure verified   ?Reason for type of repair: reduce tension to allow closure, reduce the risk of dehiscence, infection, and necrosis, reduce subcutaneous dead space and avoid a hematoma, preserve normal anatomical and functional relationships and enhance both functionality and cosmetic results   ?Undermining: edges undermined    ?Subcutaneous layers (deep stitches):  ?Suture size:  4-0 ?Suture type: Vicryl (polyglactin 910)   ?Subcutaneous suture technique: inverted dermal. ?Fine/surface layer approximation (top stitches):  ?Suture size:  4-0 ?Suture type: nylon   ?Stitches: vertical mattress and simple interrupted   ?Suture removal (days):  7 ?Hemostasis achieved with: suture ?Outcome: patient tolerated procedure well with no complications   ?Post-procedure details: sterile dressing applied and wound care instructions given   ?Dressing type: pressure dressing (Mupirocin ointment)   ? ?Cyst vs other, excised today ? ?Related Procedures ?Anatomic Pathology Report ? ? ?Return in about 1 week (around 08/18/2021) for suture removal. ? ?I, Sonya Hupman, RMA, am acting as scribe for Brendolyn Patty, MD . ? ?Documentation: I have reviewed the above documentation for accuracy and completeness, and I agree with the above. ? ?Brendolyn Patty MD  ?

## 2021-08-12 ENCOUNTER — Telehealth: Payer: Self-pay

## 2021-08-12 NOTE — Telephone Encounter (Signed)
Pt doing fine after yesterdays surgery./sh 

## 2021-08-14 LAB — ANATOMIC PATHOLOGY REPORT

## 2021-08-18 ENCOUNTER — Ambulatory Visit (INDEPENDENT_AMBULATORY_CARE_PROVIDER_SITE_OTHER): Payer: BC Managed Care – PPO | Admitting: Dermatology

## 2021-08-18 DIAGNOSIS — L720 Epidermal cyst: Secondary | ICD-10-CM

## 2021-08-18 DIAGNOSIS — Z4802 Encounter for removal of sutures: Secondary | ICD-10-CM

## 2021-08-18 NOTE — Patient Instructions (Signed)

## 2021-08-18 NOTE — Progress Notes (Signed)
? ?  Follow-Up Visit ?  ?Subjective  ?Tony Gibson is a 51 y.o. male who presents for the following: Post op (Cyst of the right inframammary). ? ? ? ?The following portions of the chart were reviewed this encounter and updated as appropriate:  ?  ?  ? ?Review of Systems:  No other skin or systemic complaints except as noted in HPI or Assessment and Plan. ? ?Objective  ?Well appearing patient in no apparent distress; mood and affect are within normal limits. ? ?A focused examination was performed including trunk. Relevant physical exam findings are noted in the Assessment and Plan. ? ?Right Inframammary ?Healing excision site. ? ? ? ?Assessment & Plan  ?Epidermal cyst ?Right Inframammary ? ?Benign, healing well. ? ?Wound cleansed, sutures removed, wound cleansed and steri strips applied. Discussed pathology results. ? ? ? ?Return as scheduled. ? ?I, Jamesetta Orleans, CMA, am acting as scribe for Brendolyn Patty, MD . ?Documentation: I have reviewed the above documentation for accuracy and completeness, and I agree with the above. ? ?Brendolyn Patty MD  ? ? ?

## 2022-01-29 ENCOUNTER — Encounter: Payer: Self-pay | Admitting: *Deleted

## 2022-01-30 ENCOUNTER — Ambulatory Visit
Admission: RE | Admit: 2022-01-30 | Discharge: 2022-01-30 | Disposition: A | Discharge status: Home / Self Care | Payer: BC Managed Care – PPO | Attending: Gastroenterology | Admitting: Gastroenterology

## 2022-01-30 ENCOUNTER — Ambulatory Visit: Payer: BC Managed Care – PPO | Admitting: Anesthesiology

## 2022-01-30 ENCOUNTER — Encounter
Admission: RE | Disposition: A | Discharge status: Home / Self Care | Payer: Self-pay | Source: Home / Self Care | Attending: Gastroenterology

## 2022-01-30 ENCOUNTER — Encounter: Payer: Self-pay | Admitting: *Deleted

## 2022-01-30 DIAGNOSIS — Z6835 Body mass index (BMI) 35.0-35.9, adult: Secondary | ICD-10-CM | POA: Diagnosis not present

## 2022-01-30 DIAGNOSIS — E669 Obesity, unspecified: Secondary | ICD-10-CM | POA: Diagnosis not present

## 2022-01-30 DIAGNOSIS — I10 Essential (primary) hypertension: Secondary | ICD-10-CM | POA: Diagnosis not present

## 2022-01-30 DIAGNOSIS — Z09 Encounter for follow-up examination after completed treatment for conditions other than malignant neoplasm: Secondary | ICD-10-CM | POA: Diagnosis not present

## 2022-01-30 DIAGNOSIS — K640 First degree hemorrhoids: Secondary | ICD-10-CM | POA: Diagnosis not present

## 2022-01-30 DIAGNOSIS — D123 Benign neoplasm of transverse colon: Secondary | ICD-10-CM | POA: Insufficient documentation

## 2022-01-30 DIAGNOSIS — F32A Depression, unspecified: Secondary | ICD-10-CM | POA: Diagnosis not present

## 2022-01-30 DIAGNOSIS — F17200 Nicotine dependence, unspecified, uncomplicated: Secondary | ICD-10-CM | POA: Diagnosis not present

## 2022-01-30 HISTORY — PX: COLONOSCOPY: SHX5424

## 2022-01-30 SURGERY — COLONOSCOPY
Anesthesia: General

## 2022-01-30 MED ORDER — STERILE WATER FOR IRRIGATION IR SOLN
Status: DC | PRN
  Administered 2022-01-30: 60 mL

## 2022-01-30 MED ORDER — PROPOFOL 500 MG/50ML IV EMUL
INTRAVENOUS | Status: DC | PRN
  Administered 2022-01-30: 125 ug/kg/min via INTRAVENOUS

## 2022-01-30 MED ORDER — PROPOFOL 10 MG/ML IV BOLUS
INTRAVENOUS | Status: DC | PRN
  Administered 2022-01-30: 50 mg via INTRAVENOUS
  Administered 2022-01-30: 70 mg via INTRAVENOUS

## 2022-01-30 MED ORDER — LIDOCAINE HCL (CARDIAC) PF 100 MG/5ML IV SOSY
PREFILLED_SYRINGE | INTRAVENOUS | Status: DC | PRN
  Administered 2022-01-30: 40 mg via INTRAVENOUS

## 2022-01-30 MED ORDER — SODIUM CHLORIDE 0.9 % IV SOLN
INTRAVENOUS | Status: DC
  Administered 2022-01-30: 1000 mL via INTRAVENOUS

## 2022-01-30 NOTE — Interval H&P Note (Signed)
History and Physical Interval Note:  01/30/2022 12:16 PM  Tony Gibson  has presented today for surgery, with the diagnosis of Hx of adenomatous polyp of colon (Z86.010).  The various methods of treatment have been discussed with the patient and family. After consideration of risks, benefits and other options for treatment, the patient has consented to  Procedure(s): COLONOSCOPY (N/A) as a surgical intervention.  The patient's history has been reviewed, patient examined, no change in status, stable for surgery.  I have reviewed the patient's chart and labs.  Questions were answered to the patient's satisfaction.     Lesly Rubenstein  Ok to proceed with colonoscopy

## 2022-01-30 NOTE — H&P (Signed)
Outpatient short stay form Pre-procedure 01/30/2022  Lesly Rubenstein, MD  Primary Physician: Dion Body, MD  Reason for visit:  Surveillance colonoscopy  History of present illness:    51 y/o gentleman with history of hypertension and obesity here for colonoscopy. Last colonoscopy was last year but prep was not adequate. No blood thinners. No family history of GI malignancies. History of appendectomy.    Current Facility-Administered Medications:    0.9 %  sodium chloride infusion, , Intravenous, Continuous, Cole Eastridge, Hilton Cork, MD, Last Rate: 20 mL/hr at 01/30/22 1206, 1,000 mL at 01/30/22 1206  Medications Prior to Admission  Medication Sig Dispense Refill Last Dose   Adapalene 0.3 % gel Apply a pea-sized amount to affected areas face every night as tolerated. 45 g 3 01/29/2022   amLODipine (NORVASC) 10 MG tablet Take 10 mg by mouth daily.   01/29/2022   aspirin EC 81 MG tablet Take 81 mg by mouth daily. Swallow whole.   Past Week   busPIRone (BUSPAR) 5 MG tablet Take 5 mg by mouth 2 (two) times daily.   01/29/2022   carvedilol (COREG) 12.5 MG tablet Take 12.5 mg by mouth 2 (two) times daily with a meal.   01/30/2022 at 0730   clobetasol cream (TEMOVATE) 0.05 % Apply to elbows 1-2 times a day until improved. Avoid face, groin, underarms. 60 g 0 01/29/2022   gabapentin (NEURONTIN) 300 MG capsule Take 300 mg by mouth at bedtime.    01/29/2022   hydrochlorothiazide (HYDRODIURIL) 25 MG tablet Take 25 mg by mouth daily.   01/30/2022 at 0730   losartan (COZAAR) 100 MG tablet Take 100 mg by mouth daily.   01/29/2022   lovastatin (MEVACOR) 10 MG tablet Take 10 mg by mouth at bedtime.   01/29/2022   PARoxetine (PAXIL) 40 MG tablet Take 40 mg by mouth at bedtime.    01/29/2022   tretinoin (RETIN-A) 0.05 % cream Apply topically at bedtime. Apply to the face nightly as tolerated 45 g 2 01/29/2022   albuterol (VENTOLIN HFA) 108 (90 Base) MCG/ACT inhaler Inhale 2 puffs into the lungs every 4 (four)  hours as needed for wheezing.    at prn   famotidine (PEPCID) 20 MG tablet Take 1 tablet (20 mg total) by mouth 2 (two) times daily. Take 1 pill once in the morning tomorrow. 1 tablet 0    Fluticasone-Salmeterol (ADVAIR) 100-50 MCG/DOSE AEPB Inhale 1 puff into the lungs 2 (two) times daily.    at prn   methocarbamol (ROBAXIN) 500 MG tablet Take 1 tablet (500 mg total) by mouth every 6 (six) hours as needed for muscle spasms. (Patient not taking: Reported on 06/25/2020) 60 tablet 0    oxyCODONE (ROXICODONE) 5 MG immediate release tablet Take 1 tablet (5 mg total) by mouth every 4 (four) hours as needed for breakthrough pain. (Patient not taking: Reported on 06/25/2020) 30 tablet 0      Allergies  Allergen Reactions   Enalapril Swelling    Throat, mouth, and feet swelling   Zithromax [Azithromycin] Swelling    Facial swelling   Losartan Potassium Swelling     Past Medical History:  Diagnosis Date   Anxiety    Depression    History of alcohol dependence (HCC)    HLD (hyperlipidemia)    Hypertension    Obesity (BMI 30.0-34.9)    Tobacco dependence     Review of systems:  Otherwise negative.    Physical Exam  Gen: Alert, oriented. Appears stated age.  HEENT: PERRLA. Lungs: No respiratory distress CV: RRR Abd: soft, benign, no masses Ext: No edema    Planned procedures: Proceed with colonoscopy. The patient understands the nature of the planned procedure, indications, risks, alternatives and potential complications including but not limited to bleeding, infection, perforation, damage to internal organs and possible oversedation/side effects from anesthesia. The patient agrees and gives consent to proceed.  Please refer to procedure notes for findings, recommendations and patient disposition/instructions.     Lesly Rubenstein, MD [facility]

## 2022-01-30 NOTE — Op Note (Signed)
[facility]astro[facility] Patient Name: Tony Gibson Procedure Date: 01/30/2022 11:25 AM MRN: [id number] Account #: [id number] Date of Birth: Jun 24, 1970 Admit Type: Outpatient Age: 51 Room: [facility] ENDO ROOM 3 Gender: Male Note Status: Finalized Instrument Name: [name] [id number] Procedure:             Colonoscopy Indications:           Surveillance: History of adenomatous polyps,                         inadequate prep on last exam (<21yr Providers:             CAndrey FarmerMD, MD Referring MD:          KDion Body(Referring MD) Medicines:             Monitored Anesthesia Care Complications:         No immediate complications. Estimated blood loss:                         Minimal. Procedure:             Pre-Anesthesia Assessment:                        - Prior to the procedure, a History and Physical was                         performed, and patient medications and allergies were                         reviewed. The patient is competent. The risks and                         benefits of the procedure and the sedation options and                         risks were discussed with the patient. All questions                         were answered and informed consent was obtained.                         Patient identification and proposed procedure were                         verified by the physician, the nurse, the                         anesthesiologist, the anesthetist and the technician                         in the endoscopy suite. Mental Status Examination:                         alert and oriented. Airway Examination: normal                         oropharyngeal airway and neck mobility. Respiratory  Examination: clear to auscultation. CV Examination:                         normal. Prophylactic Antibiotics: The patient does not                         require prophylactic antibiotics. Prior                          Anticoagulants: The patient has taken no previous                         anticoagulant or antiplatelet agents. ASA Grade                         Assessment: II - A patient with mild systemic disease.                         After reviewing the risks and benefits, the patient                         was deemed in satisfactory condition to undergo the                         procedure. The anesthesia plan was to use monitored                         anesthesia care (MAC). Immediately prior to                         administration of medications, the patient was                         re-assessed for adequacy to receive sedatives. The                         heart rate, respiratory rate, oxygen saturations,                         blood pressure, adequacy of pulmonary ventilation, and                         response to care were monitored throughout the                         procedure. The physical status of the patient was                         re-assessed after the procedure.                        After obtaining informed consent, the colonoscope was                         passed under direct vision. Throughout the procedure,                         the patient's blood pressure, pulse, and oxygen  saturations were monitored continuously. The                         Colonoscope was introduced through the anus and                         advanced to the the cecum, identified by appendiceal                         orifice and ileocecal valve. The colonoscopy was                         performed without difficulty. The patient tolerated                         the procedure well. The quality of the bowel                         preparation was good. Findings:      The perianal and digital rectal examinations were normal.      A single small-mouthed diverticulum was found in the ascending colon.      A 1 mm polyp was found in the transverse colon. The polyp  was sessile.       The polyp was removed with a jumbo cold forceps. Resection and retrieval       were complete. Estimated blood loss was minimal.      Internal hemorrhoids were found during retroflexion. The hemorrhoids       were Grade I (internal hemorrhoids that do not prolapse).      The exam was otherwise without abnormality on direct and retroflexion       views. Impression:            - Diverticulosis in the ascending colon.                        - One 1 mm polyp in the transverse colon, removed with                         a jumbo cold forceps. Resected and retrieved.                        - Internal hemorrhoids.                        - The examination was otherwise normal on direct and                         retroflexion views. Recommendation:        - Discharge patient to home.                        - Resume previous diet.                        - Continue present medications.                        - Await pathology results.                        -  Repeat colonoscopy in 7 years for surveillance.                        - Return to referring physician as previously                         scheduled. Procedure Code(s):     --- Professional ---                        [phone], Colonoscopy, flexible; with biopsy, single or                         multiple Diagnosis Code(s):     --- Professional ---                        Z86.010, Personal history of colonic polyps                        K64.0, First degree hemorrhoids                        K63.5, Polyp of colon                        K57.30, Diverticulosis of large intestine without                         perforation or abscess without bleeding CPT copyright 2019 American Medical Association. All rights reserved. The codes documented in this report are preliminary and upon coder review may  be revised to meet current compliance requirements. Andrey Farmer MD, MD 01/30/2022 12:39:45 PM Number of Addenda: 0 Note  Initiated On: 01/30/2022 11:25 AM Scope Withdrawal Time: 0 hours 9 minutes 6 seconds  Total Procedure Duration: 0 hours 13 minutes 5 seconds  Estimated Blood Loss:  Estimated blood loss was minimal.      [facility]

## 2022-01-30 NOTE — Transfer of Care (Signed)
Immediate Anesthesia Transfer of Care Note  Patient: Tony Gibson  Procedure(s) Performed: COLONOSCOPY  Patient Location: PACU  Anesthesia Type:General  Level of Consciousness: awake and alert   Airway & Oxygen Therapy: Patient Spontanous Breathing  Post-op Assessment: Report given to RN and Post -op Vital signs reviewed and stable  Post vital signs: Reviewed and stable  Last Vitals:  Vitals Value Taken Time  BP 114/83 01/30/22 1241  Temp    Pulse 86 01/30/22 1241  Resp 15 01/30/22 1241  SpO2 96 % 01/30/22 1241  Vitals shown include unvalidated device data.  Last Pain:  Vitals:   01/30/22 1151  PainSc: 0-No pain         Complications: No notable events documented.

## 2022-01-30 NOTE — Anesthesia Postprocedure Evaluation (Signed)
Anesthesia Post Note  Patient: Tony Gibson  Procedure(s) Performed: COLONOSCOPY  Patient location during evaluation: PACU Anesthesia Type: General Level of consciousness: awake and alert Pain management: pain level controlled Vital Signs Assessment: post-procedure vital signs reviewed and stable Respiratory status: spontaneous breathing, nonlabored ventilation and respiratory function stable Cardiovascular status: blood pressure returned to baseline and stable Postop Assessment: no apparent nausea or vomiting Anesthetic complications: no   No notable events documented.   Last Vitals:  Vitals:   01/30/22 1151 01/30/22 1240  BP: (!) 148/96 114/83  Pulse: 79   Resp: 18   Temp: (!) 36.2 C 36.9 C  SpO2: 97%     Last Pain:  Vitals:   01/30/22 1250  TempSrc:   PainSc: 0-No pain                 [place] Ouch

## 2022-01-30 NOTE — Anesthesia Preprocedure Evaluation (Addendum)
Anesthesia Evaluation  Patient identified by MRN, date of birth, ID band Patient awake    Reviewed: Allergy & Precautions, H&P , NPO status , Patient's Chart, lab work & pertinent test results, reviewed documented beta blocker date and time   History of Anesthesia Complications Negative for: history of anesthetic complications  Airway Mallampati: I  TM Distance: >3 FB Neck ROM: full    Dental no notable dental hx.    Pulmonary neg shortness of breath, Current Smoker and Patient abstained from smoking.,    Pulmonary exam normal        Cardiovascular Exercise Tolerance: Good hypertension, Pt. on home beta blockers and Pt. on medications (-) angina(-) Past MI Normal cardiovascular exam     Neuro/Psych PSYCHIATRIC DISORDERS Depression negative neurological ROS     GI/Hepatic negative GI ROS, Neg liver ROS,   Endo/Other  negative endocrine ROS  Renal/GU negative Renal ROS  negative genitourinary   Musculoskeletal   Abdominal (+) + obese,   Peds  Hematology negative hematology ROS (+)   Anesthesia Other Findings Past Medical History: No date: Anxiety No date: Depression No date: History of alcohol dependence (HCC) No date: HLD (hyperlipidemia) No date: Hypertension No date: Obesity (BMI 30.0-34.9) No date: Tobacco dependence  Past Surgical History: No date: APPENDECTOMY No date: ELBOW SURGERY 06/27/2018: LUMBAR LAMINECTOMY/DECOMPRESSION MICRODISCECTOMY; Left     Comment:  Procedure: LUMBAR HEMI LAMINECTOMY MICRODISCECTOMY 2               LEVELS LEFT L4/5, LEFT L5/S1;  Surgeon: Deetta Perla, MD;              Location: [facility] ORS;  Service: Neurosurgery;  Laterality:               Left;  BMI    Body Mass Index: 35.36 kg/m      Reproductive/Obstetrics negative OB ROS                            Anesthesia Physical  Anesthesia Plan  ASA: III  Anesthesia Plan: General   Post-op Pain  Management:    Induction: Intravenous  PONV Risk Score and Plan: Propofol infusion and TIVA  Airway Management Planned: Natural Airway  Additional Equipment:   Intra-op Plan:   Post-operative Plan:   Informed Consent: I have reviewed the patients History and Physical, chart, labs and discussed the procedure including the risks, benefits and alternatives for the proposed anesthesia with the patient or authorized representative who has indicated his/her understanding and acceptance.     Dental Advisory Given  Plan Discussed with: Anesthesiologist, CRNA and Surgeon  Anesthesia Plan Comments:        Anesthesia Quick Evaluation

## 2022-02-02 ENCOUNTER — Encounter: Payer: Self-pay | Admitting: Gastroenterology

## 2022-02-02 LAB — SURGICAL PATHOLOGY

## 2022-05-07 ENCOUNTER — Emergency Department: Payer: BC Managed Care – PPO

## 2022-05-07 ENCOUNTER — Emergency Department
Admission: EM | Admit: 2022-05-07 | Discharge: 2022-05-07 | Disposition: A | Discharge status: Home / Self Care | Payer: BC Managed Care – PPO | Source: Home / Self Care

## 2022-05-07 ENCOUNTER — Inpatient Hospital Stay
Admission: EM | Admit: 2022-05-07 | Discharge: 2022-05-09 | DRG: 190 | Disposition: A | Discharge status: Home / Self Care | Payer: BC Managed Care – PPO | Attending: Internal Medicine | Admitting: Internal Medicine

## 2022-05-07 DIAGNOSIS — T380X5A Adverse effect of glucocorticoids and synthetic analogues, initial encounter: Secondary | ICD-10-CM | POA: Diagnosis present

## 2022-05-07 DIAGNOSIS — Z833 Family history of diabetes mellitus: Secondary | ICD-10-CM

## 2022-05-07 DIAGNOSIS — I10 Essential (primary) hypertension: Secondary | ICD-10-CM | POA: Insufficient documentation

## 2022-05-07 DIAGNOSIS — F17200 Nicotine dependence, unspecified, uncomplicated: Secondary | ICD-10-CM

## 2022-05-07 DIAGNOSIS — Z7982 Long term (current) use of aspirin: Secondary | ICD-10-CM | POA: Diagnosis not present

## 2022-05-07 DIAGNOSIS — J9601 Acute respiratory failure with hypoxia: Secondary | ICD-10-CM

## 2022-05-07 DIAGNOSIS — K219 Gastro-esophageal reflux disease without esophagitis: Secondary | ICD-10-CM | POA: Diagnosis present

## 2022-05-07 DIAGNOSIS — R739 Hyperglycemia, unspecified: Secondary | ICD-10-CM | POA: Diagnosis present

## 2022-05-07 DIAGNOSIS — F32A Depression, unspecified: Secondary | ICD-10-CM | POA: Diagnosis present

## 2022-05-07 DIAGNOSIS — F411 Generalized anxiety disorder: Secondary | ICD-10-CM

## 2022-05-07 DIAGNOSIS — Z825 Family history of asthma and other chronic lower respiratory diseases: Secondary | ICD-10-CM | POA: Diagnosis not present

## 2022-05-07 DIAGNOSIS — E871 Hypo-osmolality and hyponatremia: Secondary | ICD-10-CM | POA: Diagnosis not present

## 2022-05-07 DIAGNOSIS — Z79899 Other long term (current) drug therapy: Secondary | ICD-10-CM

## 2022-05-07 DIAGNOSIS — R062 Wheezing: Principal | ICD-10-CM

## 2022-05-07 DIAGNOSIS — Z1152 Encounter for screening for COVID-19: Secondary | ICD-10-CM

## 2022-05-07 DIAGNOSIS — R0602 Shortness of breath: Secondary | ICD-10-CM | POA: Diagnosis present

## 2022-05-07 DIAGNOSIS — J441 Chronic obstructive pulmonary disease with (acute) exacerbation: Principal | ICD-10-CM | POA: Diagnosis present

## 2022-05-07 DIAGNOSIS — Z888 Allergy status to other drugs, medicaments and biological substances status: Secondary | ICD-10-CM | POA: Diagnosis not present

## 2022-05-07 DIAGNOSIS — Z720 Tobacco use: Secondary | ICD-10-CM | POA: Diagnosis not present

## 2022-05-07 DIAGNOSIS — F419 Anxiety disorder, unspecified: Secondary | ICD-10-CM | POA: Diagnosis present

## 2022-05-07 DIAGNOSIS — R0902 Hypoxemia: Secondary | ICD-10-CM

## 2022-05-07 DIAGNOSIS — E785 Hyperlipidemia, unspecified: Secondary | ICD-10-CM | POA: Diagnosis present

## 2022-05-07 DIAGNOSIS — F17210 Nicotine dependence, cigarettes, uncomplicated: Secondary | ICD-10-CM | POA: Diagnosis present

## 2022-05-07 LAB — CBC
HCT: 46.2 % (ref 39.0–52.0)
Hemoglobin: 15.3 g/dL (ref 13.0–17.0)
MCH: 31.2 pg (ref 26.0–34.0)
MCHC: 33.1 g/dL (ref 30.0–36.0)
MCV: 94.1 fL (ref 80.0–100.0)
Platelets: 179 10*3/uL (ref 150–400)
RBC: 4.91 MIL/uL (ref 4.22–5.81)
RDW: 12.9 % (ref 11.5–15.5)
WBC: 6.3 10*3/uL (ref 4.0–10.5)
nRBC: 0 % (ref 0.0–0.2)

## 2022-05-07 LAB — BASIC METABOLIC PANEL
Anion gap: 11 (ref 5–15)
BUN: 12 mg/dL (ref 6–20)
CO2: 28 mmol/L (ref 22–32)
Calcium: 8.5 mg/dL — ABNORMAL LOW (ref 8.9–10.3)
Chloride: 92 mmol/L — ABNORMAL LOW (ref 98–111)
Creatinine, Ser: 0.78 mg/dL (ref 0.61–1.24)
GFR, Estimated: >60 mL/min (ref >60)
Glucose, Bld: 97 mg/dL (ref 70–99)
Potassium: 3.5 mmol/L (ref 3.5–5.1)
Sodium: 131 mmol/L — ABNORMAL LOW (ref 135–145)

## 2022-05-07 LAB — RESP PANEL BY RT-PCR (RSV, FLU A&B, COVID)  RVPGX2
Influenza A by PCR: NEGATIVE
Influenza B by PCR: NEGATIVE
Resp Syncytial Virus by PCR: NEGATIVE
SARS Coronavirus 2 by RT PCR: NEGATIVE

## 2022-05-07 LAB — TROPONIN I (HIGH SENSITIVITY)
Troponin I (High Sensitivity): 4 ng/L (ref <18)
Troponin I (High Sensitivity): 6 ng/L (ref <18)

## 2022-05-07 MED ORDER — ALBUTEROL SULFATE (2.5 MG/3ML) 0.083% IN NEBU
2.5000 mg | INHALATION_SOLUTION | RESPIRATORY_TRACT | Status: DC | PRN
  Administered 2022-05-08: 2.5 mg via RESPIRATORY_TRACT
  Filled 2022-05-07: qty 3

## 2022-05-07 MED ORDER — PREDNISONE 20 MG PO TABS
60.0000 mg | ORAL_TABLET | Freq: Once | ORAL | Status: AC
  Administered 2022-05-07: 60 mg via ORAL
  Filled 2022-05-07: qty 3

## 2022-05-07 MED ORDER — PAROXETINE HCL 20 MG PO TABS
40.0000 mg | ORAL_TABLET | Freq: Every day | ORAL | Status: DC
  Administered 2022-05-08 (×2): 40 mg via ORAL
  Filled 2022-05-07 (×2): qty 2

## 2022-05-07 MED ORDER — IPRATROPIUM-ALBUTEROL 0.5-2.5 (3) MG/3ML IN SOLN
6.0000 mL | Freq: Once | RESPIRATORY_TRACT | Status: AC
  Administered 2022-05-07: 6 mL via RESPIRATORY_TRACT
  Filled 2022-05-07: qty 6

## 2022-05-07 MED ORDER — ACETAMINOPHEN 650 MG RE SUPP: 650.0000 mg | Freq: Four times a day (QID) | RECTAL | Status: DC | PRN

## 2022-05-07 MED ORDER — ONDANSETRON HCL 4 MG PO TABS: 4.0000 mg | ORAL_TABLET | Freq: Four times a day (QID) | ORAL | Status: DC | PRN

## 2022-05-07 MED ORDER — METHOCARBAMOL 500 MG PO TABS: 500.0000 mg | ORAL_TABLET | Freq: Four times a day (QID) | ORAL | Status: DC | PRN

## 2022-05-07 MED ORDER — HYDROCOD POLI-CHLORPHE POLI ER 10-8 MG/5ML PO SUER
5.0000 mL | Freq: Two times a day (BID) | ORAL | Status: DC | PRN
  Administered 2022-05-08: 5 mL via ORAL
  Filled 2022-05-07: qty 5

## 2022-05-07 MED ORDER — IPRATROPIUM-ALBUTEROL 0.5-2.5 (3) MG/3ML IN SOLN
RESPIRATORY_TRACT | Status: AC
  Filled 2022-05-07: qty 6

## 2022-05-07 MED ORDER — METHYLPREDNISOLONE SODIUM SUCC 40 MG IJ SOLR
40.0000 mg | Freq: Two times a day (BID) | INTRAMUSCULAR | Status: AC
  Administered 2022-05-08 (×2): 40 mg via INTRAVENOUS
  Filled 2022-05-07 (×2): qty 1

## 2022-05-07 MED ORDER — LOSARTAN POTASSIUM 50 MG PO TABS: 100.0000 mg | ORAL_TABLET | Freq: Every day | ORAL | Status: DC

## 2022-05-07 MED ORDER — SODIUM CHLORIDE 0.9 % IV SOLN: INTRAVENOUS | Status: DC

## 2022-05-07 MED ORDER — SODIUM CHLORIDE 0.9 % IV SOLN
1.0000 g | INTRAVENOUS | Status: DC
  Administered 2022-05-08 (×2): 1 g via INTRAVENOUS
  Filled 2022-05-07 (×4): qty 10

## 2022-05-07 MED ORDER — TRAZODONE HCL 50 MG PO TABS: 25.0000 mg | ORAL_TABLET | Freq: Every evening | ORAL | Status: DC | PRN

## 2022-05-07 MED ORDER — ASPIRIN 81 MG PO TBEC
81.0000 mg | DELAYED_RELEASE_TABLET | Freq: Every day | ORAL | Status: DC
  Administered 2022-05-08 – 2022-05-09 (×2): 81 mg via ORAL
  Filled 2022-05-07 (×2): qty 1

## 2022-05-07 MED ORDER — PRAVASTATIN SODIUM 20 MG PO TABS
10.0000 mg | ORAL_TABLET | Freq: Every day | ORAL | Status: DC
  Administered 2022-05-08: 10 mg via ORAL
  Filled 2022-05-07: qty 1

## 2022-05-07 MED ORDER — PREDNISONE 20 MG PO TABS
40.0000 mg | ORAL_TABLET | Freq: Every day | ORAL | Status: DC
  Administered 2022-05-09: 40 mg via ORAL
  Filled 2022-05-07: qty 2

## 2022-05-07 MED ORDER — ALBUTEROL SULFATE HFA 108 (90 BASE) MCG/ACT IN AERS: 2.0000 | INHALATION_SPRAY | RESPIRATORY_TRACT | Status: DC | PRN

## 2022-05-07 MED ORDER — ENOXAPARIN SODIUM 80 MG/0.8ML IJ SOSY
0.5000 mg/kg | PREFILLED_SYRINGE | INTRAMUSCULAR | Status: DC
  Administered 2022-05-08: 62.5 mg via SUBCUTANEOUS
  Filled 2022-05-07: qty 0.63

## 2022-05-07 MED ORDER — BUSPIRONE HCL 10 MG PO TABS
5.0000 mg | ORAL_TABLET | Freq: Two times a day (BID) | ORAL | Status: DC
  Administered 2022-05-08 – 2022-05-09 (×4): 5 mg via ORAL
  Filled 2022-05-07 (×4): qty 1

## 2022-05-07 MED ORDER — MAGNESIUM SULFATE 2 GM/50ML IV SOLN
2.0000 g | Freq: Once | INTRAVENOUS | Status: AC
  Administered 2022-05-07: 2 g via INTRAVENOUS
  Filled 2022-05-07: qty 50

## 2022-05-07 MED ORDER — FAMOTIDINE 20 MG PO TABS
20.0000 mg | ORAL_TABLET | Freq: Every day | ORAL | Status: DC
  Administered 2022-05-08 – 2022-05-09 (×2): 20 mg via ORAL
  Filled 2022-05-07 (×2): qty 1

## 2022-05-07 MED ORDER — AMLODIPINE BESYLATE 10 MG PO TABS
10.0000 mg | ORAL_TABLET | Freq: Every day | ORAL | Status: DC
  Administered 2022-05-08 – 2022-05-09 (×2): 10 mg via ORAL
  Filled 2022-05-07 (×2): qty 1

## 2022-05-07 MED ORDER — IPRATROPIUM-ALBUTEROL 0.5-2.5 (3) MG/3ML IN SOLN
9.0000 mL | Freq: Once | RESPIRATORY_TRACT | Status: AC
  Administered 2022-05-07: 9 mL via RESPIRATORY_TRACT
  Filled 2022-05-07: qty 3

## 2022-05-07 MED ORDER — IPRATROPIUM-ALBUTEROL 0.5-2.5 (3) MG/3ML IN SOLN
3.0000 mL | Freq: Four times a day (QID) | RESPIRATORY_TRACT | Status: DC
  Administered 2022-05-08: 3 mL via RESPIRATORY_TRACT
  Filled 2022-05-07: qty 3

## 2022-05-07 MED ORDER — ONDANSETRON HCL 4 MG/2ML IJ SOLN: 4.0000 mg | Freq: Four times a day (QID) | INTRAMUSCULAR | Status: DC | PRN

## 2022-05-07 MED ORDER — HYDROCHLOROTHIAZIDE 25 MG PO TABS
25.0000 mg | ORAL_TABLET | Freq: Every day | ORAL | Status: DC
  Administered 2022-05-08 – 2022-05-09 (×2): 25 mg via ORAL
  Filled 2022-05-07 (×3): qty 1

## 2022-05-07 MED ORDER — ACETAMINOPHEN 325 MG PO TABS: 650.0000 mg | ORAL_TABLET | Freq: Four times a day (QID) | ORAL | Status: DC | PRN

## 2022-05-07 MED ORDER — GUAIFENESIN ER 600 MG PO TB12
600.0000 mg | ORAL_TABLET | Freq: Two times a day (BID) | ORAL | Status: DC
  Administered 2022-05-08 – 2022-05-09 (×4): 600 mg via ORAL
  Filled 2022-05-07 (×4): qty 1

## 2022-05-07 MED ORDER — MAGNESIUM HYDROXIDE 400 MG/5ML PO SUSP: 30.0000 mL | Freq: Every day | ORAL | Status: DC | PRN

## 2022-05-07 MED ORDER — GABAPENTIN 300 MG PO CAPS
300.0000 mg | ORAL_CAPSULE | Freq: Three times a day (TID) | ORAL | Status: DC
  Administered 2022-05-08 – 2022-05-09 (×5): 300 mg via ORAL
  Filled 2022-05-07 (×5): qty 1

## 2022-05-07 MED ORDER — CARVEDILOL 6.25 MG PO TABS
12.5000 mg | ORAL_TABLET | Freq: Two times a day (BID) | ORAL | Status: DC
  Administered 2022-05-08 – 2022-05-09 (×3): 12.5 mg via ORAL
  Filled 2022-05-07 (×3): qty 2

## 2022-05-07 NOTE — Assessment & Plan Note (Signed)
-   We will continue H2 blocker therapy. 

## 2022-05-07 NOTE — Assessment & Plan Note (Signed)
-   He will be hydrated with IV normal saline and will follow sodium level.

## 2022-05-07 NOTE — ED Triage Notes (Signed)
Pt sts that he has been SOB for the last couple of days with fever for the last several days. Pt is currently on O2 from the [facility] walk in clinic.

## 2022-05-07 NOTE — ED Notes (Addendum)
Pt switched to portable oxygen tank and ambulated to restroom without difficulty. Upon return to room, pt became very lightheaded and unsteady on his feet for a moment. He quickly recovered and was returned to his room without incident. This RN assisted pt into gown and hooked him back up to wall oxygen and monitoring equipment.

## 2022-05-07 NOTE — Assessment & Plan Note (Signed)
-   The patient will be admitted to a medically monitored bed. - We will place the patient IV steroid therapy with IV Solu-Medrol as well as nebulized bronchodilator therapy with duonebs q.i.d. and q.4 hours p.r.n.Marland Kitchen - Mucolytic therapy will be provided with Mucinex and antibiotic therapy with IV Rocephin. - O2 protocol will be followed.

## 2022-05-07 NOTE — ED Notes (Signed)
Pt placed on room air by Jacelyn Grip, MD. Pt maintaining 85% on RA, pt being placed back on 4L at this time per Jacelyn Grip, MD.

## 2022-05-07 NOTE — Assessment & Plan Note (Signed)
-   We will continue antihypertensives. 

## 2022-05-07 NOTE — ED Provider Notes (Signed)
[facility] Provider Note    Event Date/Time   First MD Initiated Contact with Patient 05/07/22 1634     (approximate)   History   Shortness of Breath   HPI  Tony Gibson is a 52 y.o. adult   Past medical history of extensive smoking history, alcohol use anxiety depression, hypertension, hyperlipidemia increased BMI who presents to the emergency department with shortness of breath, wheezing, productive cough for the last 5 days worsening.  Subjective fever and chills.  Not formally diagnosed with COPD but primary doctor suspicious that he does have some mild COPD.    Denies abdominal pain nausea vomiting or GU symptoms.   No chest pain.  History was obtained via the patient.  EMS acts as an independent story stating that he was hypoxemic requiring nonrebreather mask prior to arrival in the emergency department.      Physical Exam   Triage Vital Signs: ED Triage Vitals  Enc Vitals Group     BP 05/07/22 1555 (!) 159/86     Pulse Rate 05/07/22 1555 82     Resp 05/07/22 1555 19     Temp 05/07/22 1555 98.7 F (37.1 C)     Temp Source 05/07/22 1555 Oral     SpO2 05/07/22 1555 (!) 84 %     Weight 05/07/22 1558 277 lb (125.6 kg)     Height --      Head Circumference --      Peak Flow --      Pain Score 05/07/22 1558 0     Pain Loc --      Pain Edu? --      Excl. in [place]? --     Most recent vital signs: Vitals:   05/07/22 2200 05/07/22 2327  BP: (!) 149/73   Pulse: 88   Resp: (!) 22   Temp:  98.8 F (37.1 C)  SpO2: 92%     General: Awake, no distress.  CV:  Good peripheral perfusion.  Resp:  Normal effort.  Abd:  No distention.  Other:  Diffuse wheezing throughout no respiratory distress getting a DuoNeb as we speak.  Skin appears warm well-perfused she is nontoxic-appearing comfortable no meningismus.   ED Results / Procedures / Treatments   Labs (all labs ordered are listed, but only abnormal results are displayed) Labs  Reviewed  BASIC METABOLIC PANEL - Abnormal; Notable for the following components:      Result Value   Sodium 131 (*)    Chloride 92 (*)    Calcium 8.5 (*)    All other components within normal limits  RESP PANEL BY RT-PCR (RSV, FLU A&B, COVID)  RVPGX2  CBC  HIV ANTIBODY (ROUTINE TESTING W REFLEX)  BASIC METABOLIC PANEL  CBC  TROPONIN I (HIGH SENSITIVITY)  TROPONIN I (HIGH SENSITIVITY)     I reviewed labs and they are notable for troponin is 4.  No white blood cell count elevation.  Viral panel is negative for flu COVID and RSV.  EKG  ED ECG REPORT I, Lucillie Garfinkel, the attending physician, personally viewed and interpreted this ECG.   Date: 05/07/2022  EKG Time: 1603  Rate: 80  Rhythm: normal sinus rhythm  Axis: nl  Intervals:imcomplete rbbb  ST&T Change: No acute ischemic changes.    RADIOLOGY I independently reviewed and interpreted chest x-ray see no obvious focalities in the thorax.   PROCEDURES:  Critical Care performed: Yes, see critical care procedure note(s)  .Critical Care  Performed  by: Lucillie Garfinkel, MD Authorized by: Lucillie Garfinkel, MD   Critical care provider statement:    Critical care time (minutes):  30   Critical care was necessary to treat or prevent imminent or life-threatening deterioration of the following conditions:  Respiratory failure   Critical care was time spent personally by me on the following activities:  Development of treatment plan with patient or surrogate, discussions with consultants, evaluation of patient's response to treatment, examination of patient, ordering and review of laboratory studies, ordering and review of radiographic studies, ordering and performing treatments and interventions, pulse oximetry, re-evaluation of patient's condition and review of old [place] ED: Medications  aspirin EC tablet 81 mg (has no administration in time range)  amLODipine (NORVASC) tablet 10 mg (has no administration in  time range)  carvedilol (COREG) tablet 12.5 mg (has no administration in time range)  hydrochlorothiazide (HYDRODIURIL) tablet 25 mg (has no administration in time range)  pravastatin (PRAVACHOL) tablet 10 mg (has no administration in time range)  busPIRone (BUSPAR) tablet 5 mg (has no administration in time range)  PARoxetine (PAXIL) tablet 40 mg (has no administration in time range)  famotidine (PEPCID) tablet 20 mg (has no administration in time range)  gabapentin (NEURONTIN) capsule 300 mg (has no administration in time range)  methocarbamol (ROBAXIN) tablet 500 mg (has no administration in time range)  cefTRIAXone (ROCEPHIN) 1 g in sodium chloride 0.9 % 100 mL IVPB (has no administration in time range)  methylPREDNISolone sodium succinate (SOLU-MEDROL) 40 mg/mL injection 40 mg (has no administration in time range)    Followed by  predniSONE (DELTASONE) tablet 40 mg (has no administration in time range)  enoxaparin (LOVENOX) injection 62.5 mg (has no administration in time range)  0.9 %  sodium chloride infusion (has no administration in time range)  acetaminophen (TYLENOL) tablet 650 mg (has no administration in time range)    Or  acetaminophen (TYLENOL) suppository 650 mg (has no administration in time range)  traZODone (DESYREL) tablet 25 mg (has no administration in time range)  magnesium hydroxide (MILK OF MAGNESIA) suspension 30 mL (has no administration in time range)  ondansetron (ZOFRAN) tablet 4 mg (has no administration in time range)    Or  ondansetron (ZOFRAN) injection 4 mg (has no administration in time range)  ipratropium-albuterol (DUONEB) 0.5-2.5 (3) MG/3ML nebulizer solution 3 mL (3 mLs Nebulization Not Given 05/07/22 2211)  albuterol (PROVENTIL) (2.5 MG/3ML) 0.083% nebulizer solution 2.5 mg (has no administration in time range)  guaiFENesin (MUCINEX) 12 hr tablet 600 mg (has no administration in time range)  chlorpheniramine-HYDROcodone (TUSSIONEX) 10-8 MG/5ML  suspension 5 mL (has no administration in time range)  ipratropium-albuterol (DUONEB) 0.5-2.5 (3) MG/3ML nebulizer solution 9 mL (9 mLs Nebulization Given 05/07/22 1712)  predniSONE (DELTASONE) tablet 60 mg (60 mg Oral Given 05/07/22 1712)  magnesium sulfate IVPB 2 g 50 mL (0 g Intravenous Stopped 05/07/22 2045)  ipratropium-albuterol (DUONEB) 0.5-2.5 (3) MG/3ML nebulizer solution 6 mL (6 mLs Nebulization Given 05/07/22 1942)    Consultants:  I spoke with the list for admission and regarding care plan for this patient.   IMPRESSION / MDM / ASSESSMENT AND PLAN / ED COURSE  I reviewed the triage vital signs and the nursing notes.                              Differential diagnosis includes, but is not limited to, COPD exacerbation, asthma  exacerbation, viral URI, bacterial pneumonia, hypoxemic respiratory failure, considered but less likely PE or ACS.   The patient is on the cardiac monitor to evaluate for evidence of arrhythmia and/or significant heart rate changes.  MDM: Smoking history likely COPD history not formally diagnosed with wheezing and cough productive of sputum for the last 5 days consistent with COPD exacerbation already responsive to DuoNebs feels subjectively better no respiratory distress.  Hypoxemic requiring new oxygen, will reassess after treatment for subjective improvement and oxygen levels determine disposition.  Viral panel is negative and x-ray is negative for focalities suspicious for bacterial pneumonia. Despite multiple rounds of DuoNebs and albuterol nebulizers, magnesium, prednisone patient feels subjectively better but remains hypoxemic to 84% on room air requiring nasal cannula oxygen at 4 L.  Admission. Patient's presentation is most consistent with acute presentation with potential threat to life or bodily function.       FINAL CLINICAL IMPRESSION(S) / ED DIAGNOSES   Final diagnoses:  Wheezing  Shortness of breath  Hypoxia     Rx / DC Orders   ED  Discharge Orders     None        Note:  This document was prepared using Dragon voice recognition software and may include unintentional dictation errors.    Lucillie Garfinkel, MD 05/07/22 2329

## 2022-05-07 NOTE — H&P (Signed)
[place]   PATIENT NAME: Tony Gibson    MR#:  [id number]  DATE OF BIRTH:  Mar 17, 1971  DATE OF ADMISSION:  05/07/2022  PRIMARY CARE PHYSICIAN: Dion Body, MD   Patient is coming from: Home  REQUESTING/REFERRING PHYSICIAN: Lucillie Garfinkel, MD  CHIEF COMPLAINT:   Chief Complaint  Patient presents with   Shortness of Breath    HISTORY OF PRESENT ILLNESS:  Tony Gibson is a 52 y.o. Caucasian adult with medical history significant for anxiety, depression, dyslipidemia, hypertension, obesity and tobacco abuse, who presented to the emergency room with acute onset of worsening dyspnea with associated cough and wheezing.  Symptoms started on Saturday with a fever of 102 and excessive cough mostly dry but congested with inability to expectorate.  He admitted to associated rhinorrhea without nasal congestion or sore throat or earache.  No chest pain or palpitations.  No nausea or vomiting or abdominal pain.  No dysuria, oliguria or hematuria or flank pain.  He is in the process of being assessed for COPD but admits to chronic smoker's cough.  ED Course: Upon presentation t to the emergency room, BP was 146/79 with otherwise normal vital signs.  Later on pulse currently dropped to 88% on room air and get up to 91% on 4 L O2 by nasal cannula.  With treatment he felt better and was taken off oxygen but it dropped again to 85% on room air.  Labs revealed mild hyponatremia and hypochloremia and unremarkable CBC.  High sensitive troponin I was 4 and later 6.  Influenza antigens, COVID-19 PCR and RSV PCR came back negative.  EKG as reviewed by me : EKG showed normal sinus rhythm with a rate of 80 with incomplete right bundle branch block. Imaging: Portable chest x-ray showed no acute cardiopulmonary disease.  The patient was given 2 DuoNebs, 2 g of IV magnesium sulfate and 60 mg p.o. prednisone.  He will be admitted to a medical telemetry bed for further evaluation and  management. PAST MEDICAL HISTORY:   Past Medical History:  Diagnosis Date   Anxiety    Depression    History of alcohol dependence ([place])    HLD (hyperlipidemia)    Hypertension    Obesity (BMI 30.0-34.9)    Tobacco dependence     PAST SURGICAL HISTORY:   Past Surgical History:  Procedure Laterality Date   APPENDECTOMY     BACK SURGERY     COLONOSCOPY N/A 06/25/2020   Procedure: COLONOSCOPY;  Surgeon: Lesly Rubenstein, MD;  Location: [facility] ENDOSCOPY;  Service: Endoscopy;  Laterality: N/A;   COLONOSCOPY N/A 01/30/2022   Procedure: COLONOSCOPY;  Surgeon: Lesly Rubenstein, MD;  Location: [facility] ENDOSCOPY;  Service: Endoscopy;  Laterality: N/A;   ELBOW SURGERY     LUMBAR LAMINECTOMY/DECOMPRESSION MICRODISCECTOMY Left 06/27/2018   Procedure: LUMBAR HEMI LAMINECTOMY MICRODISCECTOMY 2 LEVELS LEFT L4/5, LEFT L5/S1;  Surgeon: Deetta Perla, MD;  Location: [facility] ORS;  Service: Neurosurgery;  Laterality: Left;    SOCIAL HISTORY:   Social History   Tobacco Use   Smoking status: Every Day    Packs/day: 1.00    Years: 25.00    Total pack years: 25.00    Types: Cigarettes   Smokeless tobacco: Never  Substance Use Topics   Alcohol use: Yes    Comment: occ beer    FAMILY HISTORY:  Positive for diabetes mellitus in his mother and his father died of emphysema complications.  DRUG ALLERGIES:   Allergies  Allergen Reactions  Enalapril Swelling    Throat, mouth, and feet swelling   Zithromax [Azithromycin] Swelling    Facial swelling   Losartan Potassium Swelling    REVIEW OF SYSTEMS:   ROS As per history of present illness. All pertinent systems were reviewed above. Constitutional, HEENT, cardiovascular, respiratory, GI, GU, musculoskeletal, neuro, psychiatric, endocrine, integumentary and hematologic systems were reviewed and are otherwise negative/unremarkable except for positive findings mentioned above in the HPI.  MEDICATIONS AT HOME:   Prior to Admission medications    Medication Sig Start Date End Date Taking? Authorizing Provider  albuterol (VENTOLIN HFA) 108 (90 Base) MCG/ACT inhaler Inhale 2 puffs into the lungs every 4 (four) hours as needed for wheezing. 05/21/16  Yes [provider]  amLODipine (NORVASC) 10 MG tablet Take 10 mg by mouth daily. 03/25/18  Yes [provider]  aspirin EC 81 MG tablet Take 81 mg by mouth daily. Swallow whole.   Yes [provider]  busPIRone (BUSPAR) 5 MG tablet Take 5 mg by mouth 2 (two) times daily. 01/17/18  Yes [provider]  carvedilol (COREG) 12.5 MG tablet Take 12.5 mg by mouth 2 (two) times daily with a meal.   Yes [provider]  gabapentin (NEURONTIN) 300 MG capsule Take 300 mg by mouth 3 (three) times daily.   Yes [provider]  hydrochlorothiazide (HYDRODIURIL) 25 MG tablet Take 25 mg by mouth daily. 12/11/16  Yes [provider]  lovastatin (MEVACOR) 10 MG tablet Take 10 mg by mouth at bedtime. 01/08/17  Yes [provider]  PARoxetine (PAXIL) 40 MG tablet Take 40 mg by mouth at bedtime.  12/29/16  Yes [provider]  Adapalene 0.3 % gel Apply a pea-sized amount to affected areas face every night as tolerated. 07/02/21   Brendolyn Patty, MD  clobetasol cream (TEMOVATE) 0.05 % Apply to elbows 1-2 times a day until improved. Avoid face, groin, underarms. 05/20/20   Brendolyn Patty, MD  famotidine (PEPCID) 20 MG tablet Take 1 tablet (20 mg total) by mouth 2 (two) times daily. Take 1 pill once in the morning tomorrow. 10/31/18 10/31/19  Nena Polio, MD  Fluticasone-Salmeterol (ADVAIR) 100-50 MCG/DOSE AEPB Inhale 1 puff into the lungs 2 (two) times daily. Patient not taking: Reported on 05/07/2022    [provider]  losartan (COZAAR) 100 MG tablet Take 100 mg by mouth daily. Patient not taking: Reported on 05/07/2022 12/29/16   [provider]  methocarbamol (ROBAXIN) 500 MG tablet Take 1 tablet (500 mg total) by mouth every 6  (six) hours as needed for muscle spasms. Patient not taking: Reported on 06/25/2020 06/27/18   Marin Olp, PA-C  oxyCODONE (ROXICODONE) 5 MG immediate release tablet Take 1 tablet (5 mg total) by mouth every 4 (four) hours as needed for breakthrough pain. Patient not taking: Reported on 06/25/2020 06/27/18   Marin Olp, PA-C  tretinoin (RETIN-A) 0.05 % cream Apply topically at bedtime. Apply to the face nightly as tolerated Patient not taking: Reported on 05/07/2022 07/07/21 07/07/22  Brendolyn Patty, MD      VITAL SIGNS:  Blood pressure (!) 141/79, pulse 80, temperature 98.7 F (37.1 C), temperature source Oral, resp. rate 17, weight 125.6 kg, SpO2 (!) 85 %.  PHYSICAL EXAMINATION:  Physical Exam  GENERAL:  52 y.o.-year-old patient lying in the bed with mild respiratory distress with conversational dyspnea. EYES: Pupils equal, round, reactive to light and accommodation. No scleral icterus. Extraocular muscles intact.  HEENT: Head atraumatic, normocephalic. Oropharynx and nasopharynx  clear.  NECK:  Supple, no jugular venous distention. No thyroid enlargement, no tenderness.  LUNGS: Expiratory wheezes with tight expiratory airflow and harsh vesicular breathing.  No use of accessory muscles of respiration.  CARDIOVASCULAR: Regular rate and rhythm, S1, S2 normal. No murmurs, rubs, or gallops.  ABDOMEN: Soft, nondistended, nontender. Bowel sounds present. No organomegaly or mass.  EXTREMITIES: No pedal edema, cyanosis, or clubbing.  NEUROLOGIC: Cranial nerves II through XII are intact. Muscle strength 5/5 in all extremities. Sensation intact. Gait not checked.  PSYCHIATRIC: The patient is alert and oriented x 3.  Normal affect and good eye contact. SKIN: No obvious rash, lesion, or ulcer.   LABORATORY PANEL:   CBC Recent Labs  Lab 05/07/22 1600  WBC 6.3  HGB 15.3  HCT 46.2  PLT 179    ------------------------------------------------------------------------------------------------------------------  Chemistries  Recent Labs  Lab 05/07/22 1600  NA 131*  K 3.5  CL 92*  CO2 28  GLUCOSE 97  BUN 12  CREATININE 0.78  CALCIUM 8.5*   ------------------------------------------------------------------------------------------------------------------  Cardiac Enzymes No results for input(s): "TROPONINI" in the last 168 hours. ------------------------------------------------------------------------------------------------------------------  RADIOLOGY:  DG Chest Portable 1 View  Result Date: 05/07/2022 CLINICAL DATA:  Shortness of breath. EXAM: PORTABLE CHEST 1 VIEW COMPARISON:  October 31, 2018. FINDINGS: The heart size and mediastinal contours are within normal limits. Both lungs are clear. The visualized skeletal structures are unremarkable. IMPRESSION: No active disease. Electronically Signed   By: Marijo Conception M.D.   On: 05/07/2022 16:26      IMPRESSION AND PLAN:  Assessment and Plan: * COPD exacerbation ([place]) - The patient will be admitted to a medically monitored bed. - We will place the patient IV steroid therapy with IV Solu-Medrol as well as nebulized bronchodilator therapy with duonebs q.i.d. and q.4 hours p.r.n.Marland Kitchen - Mucolytic therapy will be provided with Mucinex and antibiotic therapy with IV Rocephin. - O2 protocol will be followed.   Acute respiratory failure with hypoxia (H[facility]) - O2 protocol will be followed. - This is clearly secondary to #1. - Management otherwise as above.  Hyponatremia - He will be hydrated with IV normal saline and will follow sodium level.  Tobacco abuse - I counseled him for smoking cessation and he will receive further counseling here.  Anxiety and depression - We will continue Coreg and Paxil.  GERD without esophagitis - We will continue H2 blocker therapy.  Dyslipidemia - We will continue statin  therapy.  Essential hypertension - We will continue antihypertensives.   DVT prophylaxis: Lovenox.  Advanced Care Planning:  Code Status: full code.  Family Communication:  The plan of care was discussed in details with the patient (and family). I answered all questions. The patient agreed to proceed with the above mentioned plan. Further management will depend upon hospital course. Disposition Plan: Back to previous home environment Consults called: none.  All the records are reviewed and case discussed with ED provider.  Status is: Inpatient   At the time of the admission, it appears that the appropriate admission status for this patient is inpatient.  This is judged to be reasonable and necessary in order to provide the required intensity of service to ensure the patient's safety given the presenting symptoms, physical exam findings and initial radiographic and laboratory data in the context of comorbid conditions.  The patient requires inpatient status due to high intensity of service, high risk of further deterioration and high frequency of surveillance required.  I certify that at the time of  admission, it is my clinical judgment that the patient will require inpatient hospital care extending more than 2 midnights.                            Dispo: The patient is from: Home              Anticipated d/c is to: Home              Patient currently is not medically stable to d/c.              Difficult to place patient: No  Christel Mormon M.D on 05/07/2022 at 10:08 PM  [facility]   From 7 PM-7 AM, contact night-coverage [email or web address]  CC: Primary care physician; Dion Body, MD

## 2022-05-07 NOTE — Assessment & Plan Note (Signed)
-   O2 protocol will be followed. - This is clearly secondary to #1. - Management otherwise as above.

## 2022-05-07 NOTE — Assessment & Plan Note (Signed)
I counseled him for smoking cessation and he will receive further counseling here. 

## 2022-05-07 NOTE — Assessment & Plan Note (Signed)
-   We will continue statin therapy. 

## 2022-05-07 NOTE — Assessment & Plan Note (Signed)
-   We will continue Coreg and Paxil.

## 2022-05-07 NOTE — ED Notes (Signed)
First nurse note:  Pt here from [facility] clinic due to SOB, pt on new O2 requirements. [place] staff states O2 on RA was 85%.

## 2022-05-08 ENCOUNTER — Other Ambulatory Visit: Payer: Self-pay

## 2022-05-08 ENCOUNTER — Encounter: Payer: Self-pay | Admitting: Family Medicine

## 2022-05-08 DIAGNOSIS — J441 Chronic obstructive pulmonary disease with (acute) exacerbation: Secondary | ICD-10-CM | POA: Diagnosis not present

## 2022-05-08 DIAGNOSIS — J9601 Acute respiratory failure with hypoxia: Secondary | ICD-10-CM | POA: Diagnosis not present

## 2022-05-08 DIAGNOSIS — E871 Hypo-osmolality and hyponatremia: Secondary | ICD-10-CM | POA: Diagnosis not present

## 2022-05-08 LAB — BASIC METABOLIC PANEL
Anion gap: 10 (ref 5–15)
BUN: 15 mg/dL (ref 6–20)
CO2: 30 mmol/L (ref 22–32)
Calcium: 8.5 mg/dL — ABNORMAL LOW (ref 8.9–10.3)
Chloride: 97 mmol/L — ABNORMAL LOW (ref 98–111)
Creatinine, Ser: 0.74 mg/dL (ref 0.61–1.24)
GFR, Estimated: >60 mL/min (ref >60)
Glucose, Bld: 224 mg/dL — ABNORMAL HIGH (ref 70–99)
Potassium: 4.1 mmol/L (ref 3.5–5.1)
Sodium: 137 mmol/L (ref 135–145)

## 2022-05-08 LAB — CBC
HCT: 44.4 % (ref 39.0–52.0)
Hemoglobin: 14.4 g/dL (ref 13.0–17.0)
MCH: 31 pg (ref 26.0–34.0)
MCHC: 32.4 g/dL (ref 30.0–36.0)
MCV: 95.7 fL (ref 80.0–100.0)
Platelets: 194 10*3/uL (ref 150–400)
RBC: 4.64 MIL/uL (ref 4.22–5.81)
RDW: 12.8 % (ref 11.5–15.5)
WBC: 4.8 10*3/uL (ref 4.0–10.5)
nRBC: 0 % (ref 0.0–0.2)

## 2022-05-08 LAB — HIV ANTIBODY (ROUTINE TESTING W REFLEX): HIV Screen 4th Generation wRfx: NONREACTIVE

## 2022-05-08 MED ORDER — IPRATROPIUM-ALBUTEROL 0.5-2.5 (3) MG/3ML IN SOLN
3.0000 mL | Freq: Three times a day (TID) | RESPIRATORY_TRACT | Status: DC
  Administered 2022-05-08 – 2022-05-09 (×3): 3 mL via RESPIRATORY_TRACT
  Filled 2022-05-08 (×4): qty 3

## 2022-05-08 MED ORDER — NICOTINE 14 MG/24HR TD PT24
14.0000 mg | MEDICATED_PATCH | Freq: Every day | TRANSDERMAL | Status: DC
  Administered 2022-05-08 – 2022-05-09 (×2): 14 mg via TRANSDERMAL
  Filled 2022-05-08 (×2): qty 1

## 2022-05-08 MED ORDER — ENOXAPARIN SODIUM 60 MG/0.6ML IJ SOSY
0.5000 mg/kg | PREFILLED_SYRINGE | INTRAMUSCULAR | Status: DC
  Administered 2022-05-08: 60 mg via SUBCUTANEOUS
  Filled 2022-05-08: qty 0.6

## 2022-05-08 NOTE — Progress Notes (Signed)
Nutrition Brief Note  Patient identified on the Malnutrition Screening Tool (MST) Report  Wt Readings from Last 15 Encounters:  05/08/22 121 kg  01/30/22 124.1 kg  06/25/20 121.6 kg  10/31/18 121.6 kg  06/24/18 120.2 kg  06/15/18 122.5 kg  02/15/17 119.3 kg   Pt with medical history significant for anxiety, depression, dyslipidemia, hypertension, obesity and tobacco abuse, who presented with acute onset of worsening dyspnea with associated cough and wheezing.   Pt admitted with COPD exacerbation.   Pt unavailable at time of visit. RD unable to obtain further nutrition-related history or complete nutrition-focused physical exam at this time.    Reviewed wt hx; wt has been stable over the past 4 years,   Body mass index is 35.2 kg/m. Patient meets criteria for obesity, class II based on current BMI. Obesity is a complex, chronic medical condition that is optimally managed by a multidisciplinary care team. Weight loss is not an ideal goal for an acute inpatient hospitalization. However, if further work-up for obesity is warranted, consider outpatient referral to outpatient bariatric service and/or [facility].    Current diet order is Heart Healthy (liberalize to 2 gram sodium), patient is consuming approximately n/a% of meals at this time. Labs and medications reviewed.   No nutrition interventions warranted at this time. If nutrition issues arise, please consult RD.   Loistine Chance, RD, LDN, [place] Registered Dietitian II Certified Diabetes Care and Education Specialist Please refer to [facility] for RD and/or RD on-call/weekend/after hours pager

## 2022-05-08 NOTE — Hospital Course (Signed)
Tony Gibson is a 52 y.o. Caucasian adult with medical history significant for anxiety, depression, dyslipidemia, hypertension, obesity and tobacco abuse, who presented to the emergency room with acute onset of worsening dyspnea with associated cough and wheezing.  He also had a fever recently.  He developed hypoxemia in the emergency room, saturation 85% on room air, was placed on 4 L oxygen.  Chest x-ray did not show any active disease.  COVID-19 and RSV, influenza negative. Treated with IV steroids, antibiotics for COPD exacerbation.

## 2022-05-08 NOTE — Progress Notes (Signed)
  Progress Note   Patient: Tony Gibson YPP:[id number] DOB: [birth year] DOA: 05/07/2022     1 DOS: the patient was seen and examined on 05/08/2022   Brief hospital course: Tony Gibson is a 52 y.o. Caucasian adult with medical history significant for anxiety, depression, dyslipidemia, hypertension, obesity and tobacco abuse, who presented to the emergency room with acute onset of worsening dyspnea with associated cough and wheezing.  He also had a fever recently.  He developed hypoxemia in the emergency room, saturation 85% on room air, was placed on 4 L oxygen.  Chest x-ray did not show any active disease.  COVID-19 and RSV, influenza negative. Treated with IV steroids, antibiotics for COPD exacerbation.  Assessment and Plan: Acute hypoxemic respiratory failure secondary to COPD exacerbation. COPD exacerbation. Tobacco abuse. Patient appears to be improving, currently on 3 L oxygen, will continue steroids, antibiotics and bronchodilators.  Added this incentive spirometer. Patient is strongly urged to quit smoking.  Nicotine patch ordered.  Hyponatremia. Received fluids, sodium level normalized.  Discontinue IV fluids.  Essential hypertension Continue home medicines.  Gastroesophageal reflux disease. Continue home medicine.     Subjective:  Patient feels better today, still has significant of breath with exertion.  Still on 3 L oxygen. Cough, white mucus.  Physical Exam: Vitals:   05/08/22 0429 05/08/22 0718 05/08/22 0802 05/08/22 1045  BP: 138/75  (!) 140/73   Pulse: 83  75   Resp: 18  16   Temp: 98.9 F (37.2 C)  97.9 F (36.6 C)   TempSrc:      SpO2: 94% 91% 94% 93%  Weight:      Height:       General exam: Appears calm and comfortable  Respiratory system: Decreased breathing sounds. Respiratory effort normal. Cardiovascular system: S1 & S2 heard, RRR. No JVD, murmurs, rubs, gallops or clicks. No pedal edema. Gastrointestinal system: Abdomen is nondistended,  soft and nontender. No organomegaly or masses felt. Normal bowel sounds heard. Central nervous system: Alert and oriented. No focal neurological deficits. Extremities: Symmetric 5 x 5 power. Skin: No rashes, lesions or ulcers Psychiatry: Judgement and insight appear normal. Mood & affect appropriate.   Data Reviewed:  X-ray and lab results reviewed.  Family Communication: Wife updated at bedside.  Disposition: Status is: Inpatient Remains inpatient appropriate because: Severity of disease, IV treatment.  Planned Discharge Destination: Home    Time spent: 35 minutes  Author: Sharen Hones, MD 05/08/2022 11:30 AM  For on call review [email or web address].

## 2022-05-08 NOTE — ED Notes (Signed)
Patient updated on plan of care

## 2022-05-08 NOTE — Plan of Care (Signed)

## 2022-05-09 DIAGNOSIS — J9601 Acute respiratory failure with hypoxia: Secondary | ICD-10-CM | POA: Diagnosis not present

## 2022-05-09 DIAGNOSIS — J441 Chronic obstructive pulmonary disease with (acute) exacerbation: Secondary | ICD-10-CM | POA: Diagnosis not present

## 2022-05-09 DIAGNOSIS — I10 Essential (primary) hypertension: Secondary | ICD-10-CM | POA: Diagnosis not present

## 2022-05-09 MED ORDER — NICOTINE 14 MG/24HR TD PT24: 14.0000 mg | MEDICATED_PATCH | Freq: Every day | TRANSDERMAL | 0 refills | Status: AC

## 2022-05-09 MED ORDER — FLUTICASONE-SALMETEROL 230-21 MCG/ACT IN AERO: 2.0000 | INHALATION_SPRAY | Freq: Two times a day (BID) | RESPIRATORY_TRACT | 0 refills | Status: DC

## 2022-05-09 MED ORDER — PREDNISONE 20 MG PO TABS: ORAL_TABLET | ORAL | 0 refills | Status: AC

## 2022-05-09 NOTE — Discharge Summary (Signed)
Physician Discharge Summary   Patient: Tony Gibson MRN: [id number] DOB: 1970/11/02  Admit date:     05/07/2022  Discharge date: 05/09/22  Discharge Physician: Sharen Hones   PCP: Dion Body, MD   Recommendations at discharge:   Follow-up with PCP in 1 week. Patient is due for lung cancer screening, please consider at the next office visit.  Discharge Diagnoses: Principal Problem:   COPD exacerbation ([place]) Active Problems:   Acute respiratory failure with hypoxia (HCC)   Tobacco abuse   Hyponatremia   Anxiety and depression   Essential hypertension   Dyslipidemia   GERD without esophagitis Hyperglycemia secondary to steroid use. Resolved Problems:   * No resolved hospital problems. *  Hospital Course: Tony Gibson is a 52 y.o. Caucasian adult with medical history significant for anxiety, depression, dyslipidemia, hypertension, obesity and tobacco abuse, who presented to the emergency room with acute onset of worsening dyspnea with associated cough and wheezing.  He also had a fever recently.  He developed hypoxemia in the emergency room, saturation 85% on room air, was placed on 4 L oxygen.  Chest x-ray did not show any active disease.  COVID-19 and RSV, influenza negative. Treated with IV steroids, antibiotics for COPD exacerbation. Patient condition much improved, now weaned down to 1 L oxygen, patient no longer has short of breath.  He is medically stable to be discharged.  Will obtain home health evaluation. Assessment and Plan: Acute hypoxemic respiratory failure secondary to COPD exacerbation. COPD exacerbation. Tobacco abuse. Condition had improved, will continue oral steroids, no evidence of bacterial infection.  No need for additional antibiotics. Patient be followed by PCP as outpatient.   Hyponatremia. Received fluids, sodium level normalized.     Essential hypertension Continue home medicines.   Gastroesophageal reflux disease. Continue home  medicine.       Consultants: None Procedures performed: None  Disposition: Home Diet recommendation:  Discharge Diet Orders (From admission, onward)     Start     Ordered   05/09/22 0000  Diet - low sodium heart healthy        05/09/22 0941           Cardiac diet DISCHARGE MEDICATION: Allergies as of 05/09/2022       Reactions   Enalapril Swelling   Throat, mouth, and feet swelling   Zithromax [azithromycin] Swelling   Facial swelling   Losartan Potassium Swelling        Medication List     STOP taking these medications    Adapalene 0.3 % gel   clobetasol cream 0.05 % Commonly known as: TEMOVATE   Fluticasone-Salmeterol 100-50 MCG/DOSE Aepb Commonly known as: ADVAIR Replaced by: fluticasone-salmeterol 230-21 MCG/ACT inhaler   losartan 100 MG tablet Commonly known as: COZAAR   methocarbamol 500 MG tablet Commonly known as: Robaxin   oxyCODONE 5 MG immediate release tablet Commonly known as: Roxicodone   tretinoin 0.05 % cream Commonly known as: RETIN-A       TAKE these medications    acetaminophen 650 MG CR tablet Commonly known as: TYLENOL Take 650 mg by mouth every 4 (four) hours as needed for pain.   albuterol 108 (90 Base) MCG/ACT inhaler Commonly known as: VENTOLIN HFA Inhale 2 puffs into the lungs every 4 (four) hours as needed for wheezing.   amLODipine 10 MG tablet Commonly known as: NORVASC Take 10 mg by mouth daily.   aspirin EC 81 MG tablet Take 81 mg by mouth daily. Swallow whole.   busPIRone 5  MG tablet Commonly known as: BUSPAR Take 5 mg by mouth 2 (two) times daily.   carvedilol 12.5 MG tablet Commonly known as: COREG Take 12.5 mg by mouth 2 (two) times daily with a meal.   CORICIDIN D PO Take 1 tablet by mouth every 6 (six) hours as needed (for sinus congestion).   famotidine 20 MG tablet Commonly known as: PEPCID Take 1 tablet (20 mg total) by mouth 2 (two) times daily. Take 1 pill once in the morning  tomorrow.   fluticasone-salmeterol 230-21 MCG/ACT inhaler Commonly known as: Advair HFA Inhale 2 puffs into the lungs 2 (two) times daily. Replaces: Fluticasone-Salmeterol 100-50 MCG/DOSE Aepb   gabapentin 300 MG capsule Commonly known as: NEURONTIN Take 300 mg by mouth 3 (three) times daily.   hydrochlorothiazide 25 MG tablet Commonly known as: HYDRODIURIL Take 25 mg by mouth daily.   lovastatin 10 MG tablet Commonly known as: MEVACOR Take 10 mg by mouth at bedtime.   nicotine 14 mg/24hr patch Commonly known as: NICODERM CQ - dosed in mg/24 hours Place 1 patch (14 mg total) onto the skin daily. Start taking on: May 10, 2022   PARoxetine 40 MG tablet Commonly known as: PAXIL Take 40 mg by mouth at bedtime.   predniSONE 20 MG tablet Commonly known as: DELTASONE Take 2 tablets (40 mg total) by mouth daily with breakfast for 2 days, THEN 1 tablet (20 mg total) daily with breakfast for 2 days, THEN 0.5 tablets (10 mg total) daily with breakfast for 2 days. Start taking on: May 10, 2022        Follow-up Information     Dion Body, MD Follow up in 1 week(s).   Specialty: Family Medicine Contact information: [address] [place] [address] [place] [address] [phone]                Discharge Exam: Danley Danker Weights   05/07/22 1558 05/08/22 0206  Weight: 125.6 kg 121 kg   General exam: Appears calm and comfortable  Respiratory system: Decreased breathing sounds. Respiratory effort normal. Cardiovascular system: S1 & S2 heard, RRR. No JVD, murmurs, rubs, gallops or clicks. No pedal edema. Gastrointestinal system: Abdomen is nondistended, soft and nontender. No organomegaly or masses felt. Normal bowel sounds heard. Central nervous system: Alert and oriented. No focal neurological deficits. Extremities: Symmetric 5 x 5 power. Skin: No rashes, lesions or ulcers Psychiatry: Judgement and insight appear normal. Mood & affect  appropriate.    Condition at discharge: good  The results of significant diagnostics from this hospitalization (including imaging, microbiology, ancillary and laboratory) are listed below for reference.   Imaging Studies: DG Chest Portable 1 View  Result Date: 05/07/2022 CLINICAL DATA:  Shortness of breath. EXAM: PORTABLE CHEST 1 VIEW COMPARISON:  October 31, 2018. FINDINGS: The heart size and mediastinal contours are within normal limits. Both lungs are clear. The visualized skeletal structures are unremarkable. IMPRESSION: No active disease. Electronically Signed   By: Marijo Conception M.D.   On: 05/07/2022 16:26    Microbiology: Results for orders placed or performed during the hospital encounter of 05/07/22  Resp panel by RT-PCR (RSV, Flu A&B, Covid) Anterior Nasal Swab     Status: None   Collection Time: 05/07/22  4:00 PM   Specimen: Anterior Nasal Swab  Result Value Ref Range Status   SARS Coronavirus 2 by RT PCR NEGATIVE NEGATIVE Final    Comment: (NOTE) SARS-CoV-2 target nucleic acids are NOT DETECTED.  The SARS-CoV-2 RNA is  generally detectable in upper respiratory specimens during the acute phase of infection. The lowest concentration of SARS-CoV-2 viral copies this assay can detect is 138 copies/mL. A negative result does not preclude SARS-Cov-2 infection and should not be used as the sole basis for treatment or other patient management decisions. A negative result may occur with  improper specimen collection/handling, submission of specimen other than nasopharyngeal swab, presence of viral mutation(s) within the areas targeted by this assay, and inadequate number of viral copies(<138 copies/mL). A negative result must be combined with clinical observations, patient history, and epidemiological information. The expected result is Negative.  Fact Sheet for Patients:  EntrepreneurPulse.com.au  Fact Sheet for Healthcare Providers:   IncredibleEmployment.be  This test is no t yet approved or cleared by the [place] FDA and  has been authorized for detection and/or diagnosis of SARS-CoV-2 by FDA under an Emergency Use Authorization (EUA). This EUA will remain  in effect (meaning this test can be used) for the duration of the COVID-19 declaration under Section 564(b)(1) of the Act, 21 U.S.C.section 360bbb-3(b)(1), unless the authorization is terminated  or revoked sooner.       Influenza A by PCR NEGATIVE NEGATIVE Final   Influenza B by PCR NEGATIVE NEGATIVE Final    Comment: (NOTE) The Xpert Xpress SARS-CoV-2/FLU/RSV plus assay is intended as an aid in the diagnosis of influenza from Nasopharyngeal swab specimens and should not be used as a sole basis for treatment. Nasal washings and aspirates are unacceptable for Xpert Xpress SARS-CoV-2/FLU/RSV testing.  Fact Sheet for Patients: EntrepreneurPulse.com.au  Fact Sheet for Healthcare Providers: IncredibleEmployment.be  This test is not yet approved or cleared by the [place] FDA and has been authorized for detection and/or diagnosis of SARS-CoV-2 by FDA under an Emergency Use Authorization (EUA). This EUA will remain in effect (meaning this test can be used) for the duration of the COVID-19 declaration under Section 564(b)(1) of the Act, 21 U.S.C. section 360bbb-3(b)(1), unless the authorization is terminated or revoked.     Resp Syncytial Virus by PCR NEGATIVE NEGATIVE Final    Comment: (NOTE) Fact Sheet for Patients: EntrepreneurPulse.com.au  Fact Sheet for Healthcare Providers: IncredibleEmployment.be  This test is not yet approved or cleared by the [place] FDA and has been authorized for detection and/or diagnosis of SARS-CoV-2 by FDA under an Emergency Use Authorization (EUA). This EUA will remain in effect (meaning this test can be used) for  the duration of the COVID-19 declaration under Section 564(b)(1) of the Act, 21 U.S.C. section 360bbb-3(b)(1), unless the authorization is terminated or revoked.  Performed at [facility], [address] [place], [place] [address]     Labs: CBC: Recent Labs  Lab 05/07/22 1600 05/08/22 0550  WBC 6.3 4.8  HGB 15.3 14.4  HCT 46.2 44.4  MCV 94.1 95.7  PLT 179 144   Basic Metabolic Panel: Recent Labs  Lab 05/07/22 1600 05/08/22 0550  NA 131* 137  K 3.5 4.1  CL 92* 97*  CO2 28 30  GLUCOSE 97 224*  BUN 12 15  CREATININE 0.78 0.74  CALCIUM 8.5* 8.5*   Liver Function Tests: No results for input(s): "AST", "ALT", "ALKPHOS", "BILITOT", "PROT", "ALBUMIN" in the last 168 hours. CBG: No results for input(s): "GLUCAP" in the last 168 hours.  Discharge time spent: greater than 30 minutes.  Signed: Sharen Hones, MD [facility] 05/09/2022

## 2022-05-09 NOTE — Progress Notes (Signed)
SATURATION QUALIFICATIONS: (This note is used to comply with regulatory documentation for home oxygen)  Patient Saturations on Room Air at Rest = 94%  Patient Saturations on Room Air while Ambulating = 88%   Please briefly explain why patient needs home oxygen: Pt walked independently. Pt was not having SOB when ambulating, saturation between 88%-90% Room-air.

## 2022-05-09 NOTE — Progress Notes (Signed)
Called pt and wife to update regarding the changed of pharmacy for the inhaler but with no response.

## 2022-07-14 ENCOUNTER — Encounter: Payer: BC Managed Care – PPO | Admitting: Dermatology

## 2022-09-17 ENCOUNTER — Other Ambulatory Visit: Payer: Self-pay

## 2022-09-17 DIAGNOSIS — I700 Atherosclerosis of aorta: Secondary | ICD-10-CM

## 2022-09-17 DIAGNOSIS — Z9189 Other specified personal risk factors, not elsewhere classified: Secondary | ICD-10-CM

## 2022-09-24 ENCOUNTER — Ambulatory Visit
Admission: RE | Admit: 2022-09-24 | Discharge: 2022-09-24 | Disposition: A | Discharge status: Home / Self Care | Payer: BC Managed Care – PPO | Source: Ambulatory Visit | Attending: Family Medicine | Admitting: Family Medicine

## 2022-09-24 DIAGNOSIS — I700 Atherosclerosis of aorta: Secondary | ICD-10-CM | POA: Insufficient documentation

## 2022-09-24 DIAGNOSIS — Z9189 Other specified personal risk factors, not elsewhere classified: Secondary | ICD-10-CM | POA: Insufficient documentation

## 2023-06-29 ENCOUNTER — Other Ambulatory Visit: Payer: Self-pay | Admitting: Family Medicine

## 2023-06-29 DIAGNOSIS — R17 Unspecified jaundice: Secondary | ICD-10-CM

## 2023-06-29 DIAGNOSIS — Z0000 Encounter for general adult medical examination without abnormal findings: Secondary | ICD-10-CM

## 2023-07-15 ENCOUNTER — Other Ambulatory Visit

## 2023-07-20 ENCOUNTER — Inpatient Hospital Stay: Admission: RE | Admit: 2023-07-20 | Source: Ambulatory Visit

## 2023-09-28 ENCOUNTER — Ambulatory Visit
Admission: RE | Admit: 2023-09-28 | Discharge: 2023-09-28 | Disposition: A | Discharge status: Home / Self Care | Source: Ambulatory Visit | Attending: Family Medicine | Admitting: Family Medicine

## 2023-09-28 DIAGNOSIS — Z0000 Encounter for general adult medical examination without abnormal findings: Secondary | ICD-10-CM

## 2023-09-28 DIAGNOSIS — R17 Unspecified jaundice: Secondary | ICD-10-CM

## 2023-09-28 MED ORDER — IOPAMIDOL (ISOVUE-300) INJECTION 61%
100.0000 mL | Freq: Once | INTRAVENOUS | Status: AC | PRN
  Administered 2023-09-28: 100 mL via INTRAVENOUS

## 2023-11-22 NOTE — Progress Notes (Signed)
 Chief Complaint  Patient presents with  . Follow-up    HPI  Tony Gibson is a 53 y.o. here for follow up of chronic medical issues  Anxiety and depression: Still having episodes of anxiety.  Taking medications as prescribed.  Not followed by counselor or therapist.  Tolerating Paxil  at higher dose.  Tobacco dependence: Patient has increased tobacco use up to 1 pack/day.  Not ready to quit.  HTN: No acute issues.  Tolerating meds without adverse effects.  Home avg bp 130-140s/80-90s.  Denies HAs, vision changes, dizziness, cp, or syncopal episodes.  Mixed HLD: No acute issues.  Tolerating medications without adverse effects.  Denies any myalgia.  No cp or CVA symptoms.   AODM: Patient experienced mild GI upset when he started on the metformin but improved.   Reports fluctuating CBGs based on what he eats.  No hypoglycemic episodes.  No new neuropathy.    Obesity: Aware of weight issue.  No specific weight loss regimen.   ROS Review of systems is unremarkable for any active cardiac, respiratory, GI, GU, hematologic, neurologic, dermatologic, HEENT, or psychiatric symptoms except as noted above.  No fevers, chills, or constitutional symptoms.   Current Outpatient Medications  Medication Sig Dispense Refill  . amLODIPine  (NORVASC ) 10 MG tablet TAKE 1 TABLET BY MOUTH ONCE  DAILY 90 tablet 3  . aspirin  81 MG EC tablet Take 81 mg by mouth once daily.    . blood glucose diagnostic test strip Check blood sugar once daily fasting. Dx E11.9 ONE TOUCH 100 each 1  . blood glucose meter kit Check blood sugar once daily fasting. Dx E11.9 ONE TOUCH 1 each 0  . busPIRone  (BUSPAR ) 15 MG tablet TAKE 1 TABLET(15 MG) BY MOUTH TWICE DAILY 200 tablet 1  . carvediloL  (COREG ) 12.5 MG tablet Take 1 tablet (12.5 mg total) by mouth 2 (two) times daily with meals 200 tablet 1  . hydroCHLOROthiazide  (HYDRODIURIL ) 25 MG tablet TAKE 1 TABLET BY MOUTH ONCE  DAILY 90 tablet 3  . lancets Check blood sugar once  daily fasting. Dx E11.9 ONE TOUCH 100 each 1  . lovastatin (MEVACOR) 10 MG tablet TAKE 1 TABLET BY MOUTH DAILY  WITH DINNER 90 tablet 3  . metFORMIN (GLUCOPHAGE-XR) 500 MG XR tablet Take 1 tablet (500 mg total) by mouth daily with dinner 100 tablet 1  . PARoxetine  (PAXIL ) 20 MG tablet TAKE 1 TABLET BY MOUTH AT NIGHT ALONG WITH 30MG  TABLET OF PAXIL  TO EQUAL 50MG  90 tablet 1  . PARoxetine  (PAXIL ) 30 MG tablet TAKE 1 TABLET BY MOUTH NIGHTLY ALONG WITH 20 MG PAXIL  TABLET 90 tablet 1  . sildenafiL (VIAGRA) 50 MG tablet 1/2 to 1 tab daily 1-2 hours prior to intercourse prn for ED 10 tablet 1   No current facility-administered medications for this visit.    Allergies as of 11/22/2023 - Reviewed 11/22/2023  Allergen Reaction Noted  . Azithromycin Swelling 10/09/2013  . Enalapril Swelling 10/09/2013  . Losartan  potassium Swelling 11/01/2018    Patient Active Problem List  Diagnosis  . GAD (generalized anxiety disorder)  . Tobacco dependence (1 ppd)  . Recurrent major depressive disorder, in remission ()  . Essential hypertension with goal blood pressure less than 140/90  . Class 2 severe obesity due to excess calories with serious comorbidity and body mass index (BMI) of 37.0 to 37.9 in adult (CMS/HHS-HCC)  . Aortic atherosclerosis (CXR 09/01/16; LDL 69 - 10/01/23)  . Lumbar radiculopathy, chronic - followed by Dr. Dodson  .  Diabetes mellitus with coincident hypertension (A1c 7.2% - 10/01/23)  . History of angioedema (10/2018)   . Hypertriglyceridemia (Trig 228, LDL 69 - 10/01/23)  . 10 year risk of MI or stroke 7.5% or greater  . Fatty liver (Cardiac CT 09/24/22)  . Other male erectile dysfunction    Past Medical History:  Diagnosis Date  . Anxiety   . Depression   . History of alcohol dependence (CMS/HHS-HCC)   . HTN (hypertension)   . Obesity (BMI 30.0-34.9)   . Tobacco dependence     Past Surgical History:  Procedure Laterality Date  . Left L4-5 Left L5-S1 hemilaminectomy & discectomy   06/27/2018   Dr Elspeth [facility]le at [facility]  . COLONOSCOPY  06/25/2020   Tubular adenoma/Repeat 65yr/CTL  . COLONOSCOPY  01/30/2022   Tubular adenoma/PHx CP/Repeat 70yrs/CTL  . APPENDECTOMY      Vitals:   11/22/23 0941 11/22/23 0944  BP: (!) 164/82 (!) 158/80  Pulse: 80   SpO2: 97%   Weight: (!) 128.4 kg (283 lb)   Height: 185.4 cm (6' 1)   PainSc:   4   PainLoc: Back    Body mass index is 37.34 kg/m.  Exam  General. Well appearing; NAD; VS reviewed     Eyes. Sclera and conjunctiva clear; Vision grossly intact; extraocular movements intact Neck. Supple.  Lungs. Respirations unlabored; clear to auscultation bilaterally Cardiovascular. Heart regular rate and rhythm without murmurs, gallops, or rubs Abdomen. Soft; non tender; non distended; no masses or organomegaly Extremities. no edema Skin. Normal color and turgor Neurologic. Alert and oriented x3; CN 2-12 grossly intact; no focal deficits  Assessment and Plan  1. Diabetes mellitus with coincident hypertension (A1c 7.2% - 10/01/23) Improving.  Continue with current regimen and focus more nutrition modification and exercise.  Check CBGs daily fasting with goal less than 130.  Focus on low sugar/starch diet.  No changes to metformin dosing. -     Comprehensive Metabolic Panel (CMP); Future -     Hemoglobin A1C; Future -     Microalbumin, Random Urine; Future  2. Class 2 severe obesity due to excess calories with serious comorbidity and body mass index (BMI) of 37.0 to 37.9 in adult (CMS/HHS-HCC) Not at goal.  Counseled on nutrition modification (low sugar/carb mediterranean) and exercise (strength/cardio).  Goal is to lose 1lb/wk.  Focus on low calorie diet (< net 1400/day).    3. Essential hypertension with goal blood pressure less than 130/80 Acutely elevated but better controlled at home.  No changes to regimen at this time.  Kidney function and electrolytes are within normal limits.  Focus on nutrition modification and exercise.   Check blood pressure twice a week with goal less than 130/80  4. Hypertriglyceridemia (Trig 228, LDL 69 - 10/01/23) Worsened.  LFTs wnls (09/2023). Continue with current regimen.  Counseled on mediterranean diet and aerobic exercise. -     Lipid Panel w/calc LDL; Future  5. Recurrent major depressive disorder, in remission () Stable.  Continue with current regimen.  6. GAD (generalized anxiety disorder) Stable.  Continue with current regimen.  7. Aortic atherosclerosis (CXR 09/01/16; LDL 69 - 10/01/23) Well controlled.  LFTs wnls (09/2023). Continue with current regimen.  Counseled on mediterranean diet and aerobic exercise.  8. Tobacco dependence (1 ppd) Worsened.  Smoke cessation counseling provided.  Not ready quit.  Other orders -     Follow up in Primary Care; Future  F/u 6 months for recheck.  Labs prior  ALDA CARPEN, MD

## 2024-02-14 ENCOUNTER — Emergency Department

## 2024-02-14 ENCOUNTER — Inpatient Hospital Stay
Admission: EM | Admit: 2024-02-14 | Discharge: 2024-02-17 | DRG: 190 | Disposition: A | Discharge status: Home / Self Care | Attending: Hospitalist | Admitting: Hospitalist

## 2024-02-14 ENCOUNTER — Other Ambulatory Visit: Payer: Self-pay

## 2024-02-14 DIAGNOSIS — E119 Type 2 diabetes mellitus without complications: Secondary | ICD-10-CM | POA: Diagnosis not present

## 2024-02-14 DIAGNOSIS — B9789 Other viral agents as the cause of diseases classified elsewhere: Secondary | ICD-10-CM | POA: Insufficient documentation

## 2024-02-14 DIAGNOSIS — J329 Chronic sinusitis, unspecified: Secondary | ICD-10-CM | POA: Insufficient documentation

## 2024-02-14 DIAGNOSIS — F1010 Alcohol abuse, uncomplicated: Secondary | ICD-10-CM | POA: Diagnosis not present

## 2024-02-14 DIAGNOSIS — Z23 Encounter for immunization: Secondary | ICD-10-CM | POA: Insufficient documentation

## 2024-02-14 DIAGNOSIS — Z881 Allergy status to other antibiotic agents status: Secondary | ICD-10-CM

## 2024-02-14 DIAGNOSIS — Z1152 Encounter for screening for COVID-19: Secondary | ICD-10-CM | POA: Insufficient documentation

## 2024-02-14 DIAGNOSIS — J9601 Acute respiratory failure with hypoxia: Secondary | ICD-10-CM | POA: Diagnosis not present

## 2024-02-14 DIAGNOSIS — F329 Major depressive disorder, single episode, unspecified: Secondary | ICD-10-CM | POA: Diagnosis present

## 2024-02-14 DIAGNOSIS — I700 Atherosclerosis of aorta: Secondary | ICD-10-CM | POA: Insufficient documentation

## 2024-02-14 DIAGNOSIS — Z888 Allergy status to other drugs, medicaments and biological substances status: Secondary | ICD-10-CM | POA: Diagnosis not present

## 2024-02-14 DIAGNOSIS — F17200 Nicotine dependence, unspecified, uncomplicated: Secondary | ICD-10-CM | POA: Diagnosis not present

## 2024-02-14 DIAGNOSIS — Z7982 Long term (current) use of aspirin: Secondary | ICD-10-CM | POA: Diagnosis not present

## 2024-02-14 DIAGNOSIS — Z7984 Long term (current) use of oral hypoglycemic drugs: Secondary | ICD-10-CM | POA: Diagnosis not present

## 2024-02-14 DIAGNOSIS — Z6836 Body mass index (BMI) 36.0-36.9, adult: Secondary | ICD-10-CM | POA: Insufficient documentation

## 2024-02-14 DIAGNOSIS — I110 Hypertensive heart disease with heart failure: Secondary | ICD-10-CM | POA: Diagnosis not present

## 2024-02-14 DIAGNOSIS — F411 Generalized anxiety disorder: Secondary | ICD-10-CM | POA: Diagnosis not present

## 2024-02-14 DIAGNOSIS — Z7901 Long term (current) use of anticoagulants: Secondary | ICD-10-CM | POA: Insufficient documentation

## 2024-02-14 DIAGNOSIS — I10 Essential (primary) hypertension: Secondary | ICD-10-CM | POA: Insufficient documentation

## 2024-02-14 DIAGNOSIS — E785 Hyperlipidemia, unspecified: Secondary | ICD-10-CM | POA: Insufficient documentation

## 2024-02-14 DIAGNOSIS — F1090 Alcohol use, unspecified, uncomplicated: Secondary | ICD-10-CM | POA: Insufficient documentation

## 2024-02-14 DIAGNOSIS — R0602 Shortness of breath: Secondary | ICD-10-CM | POA: Diagnosis present

## 2024-02-14 DIAGNOSIS — F17210 Nicotine dependence, cigarettes, uncomplicated: Secondary | ICD-10-CM | POA: Insufficient documentation

## 2024-02-14 DIAGNOSIS — K760 Fatty (change of) liver, not elsewhere classified: Secondary | ICD-10-CM | POA: Insufficient documentation

## 2024-02-14 DIAGNOSIS — J441 Chronic obstructive pulmonary disease with (acute) exacerbation: Principal | ICD-10-CM | POA: Diagnosis present

## 2024-02-14 DIAGNOSIS — Z79899 Other long term (current) drug therapy: Secondary | ICD-10-CM | POA: Insufficient documentation

## 2024-02-14 DIAGNOSIS — R0902 Hypoxemia: Secondary | ICD-10-CM

## 2024-02-14 DIAGNOSIS — E66812 Obesity, class 2: Secondary | ICD-10-CM | POA: Insufficient documentation

## 2024-02-14 DIAGNOSIS — F321 Major depressive disorder, single episode, moderate: Secondary | ICD-10-CM | POA: Diagnosis not present

## 2024-02-14 LAB — TROPONIN I (HIGH SENSITIVITY)
Troponin I (High Sensitivity): 4 ng/L (ref <18)
Troponin I (High Sensitivity): 4 ng/L (ref <18)

## 2024-02-14 LAB — BASIC METABOLIC PANEL WITH GFR
Anion gap: 16 — ABNORMAL HIGH (ref 5–15)
BUN: 10 mg/dL (ref 6–20)
CO2: 27 mmol/L (ref 22–32)
Calcium: 9 mg/dL (ref 8.9–10.3)
Chloride: 94 mmol/L — ABNORMAL LOW (ref 98–111)
Creatinine, Ser: 0.83 mg/dL (ref 0.61–1.24)
GFR, Estimated: >60 mL/min (ref >60)
Glucose, Bld: 118 mg/dL — ABNORMAL HIGH (ref 70–99)
Potassium: 3.8 mmol/L (ref 3.5–5.1)
Sodium: 137 mmol/L (ref 135–145)

## 2024-02-14 LAB — CBC
HCT: 52.9 % — ABNORMAL HIGH (ref 39.0–52.0)
Hemoglobin: 17.2 g/dL — ABNORMAL HIGH (ref 13.0–17.0)
MCH: 31.7 pg (ref 26.0–34.0)
MCHC: 32.5 g/dL (ref 30.0–36.0)
MCV: 97.4 fL (ref 80.0–100.0)
Platelets: 231 K/uL (ref 150–400)
RBC: 5.43 MIL/uL (ref 4.22–5.81)
RDW: 13.1 % (ref 11.5–15.5)
WBC: 8.7 K/uL (ref 4.0–10.5)
nRBC: 0 % (ref 0.0–0.2)

## 2024-02-14 LAB — RESP PANEL BY RT-PCR (RSV, FLU A&B, COVID)  RVPGX2
Influenza A by PCR: NEGATIVE
Influenza B by PCR: NEGATIVE
Resp Syncytial Virus by PCR: NEGATIVE
SARS Coronavirus 2 by RT PCR: NEGATIVE

## 2024-02-14 LAB — MAGNESIUM: Magnesium: 2.7 mg/dL — ABNORMAL HIGH (ref 1.7–2.4)

## 2024-02-14 LAB — HIV ANTIBODY (ROUTINE TESTING W REFLEX): HIV Screen 4th Generation wRfx: NONREACTIVE

## 2024-02-14 MED ORDER — IPRATROPIUM-ALBUTEROL 0.5-2.5 (3) MG/3ML IN SOLN
3.0000 mL | Freq: Four times a day (QID) | RESPIRATORY_TRACT | Status: DC
  Administered 2024-02-15 (×2): 3 mL via RESPIRATORY_TRACT
  Filled 2024-02-14 (×2): qty 3

## 2024-02-14 MED ORDER — HYDROCHLOROTHIAZIDE 25 MG PO TABS
25.0000 mg | ORAL_TABLET | Freq: Every day | ORAL | Status: DC
  Administered 2024-02-14 – 2024-02-17 (×4): 25 mg via ORAL
  Filled 2024-02-14 (×4): qty 1

## 2024-02-14 MED ORDER — PAROXETINE HCL 20 MG PO TABS
50.0000 mg | ORAL_TABLET | Freq: Every day | ORAL | Status: DC
  Administered 2024-02-14 – 2024-02-16 (×3): 50 mg via ORAL
  Filled 2024-02-14 (×4): qty 1

## 2024-02-14 MED ORDER — MAGNESIUM SULFATE 2 GM/50ML IV SOLN
2.0000 g | Freq: Once | INTRAVENOUS | Status: AC
  Administered 2024-02-14: 2 g via INTRAVENOUS
  Filled 2024-02-14: qty 50

## 2024-02-14 MED ORDER — ENOXAPARIN SODIUM 60 MG/0.6ML IJ SOSY
60.0000 mg | PREFILLED_SYRINGE | INTRAMUSCULAR | Status: DC
  Administered 2024-02-14 – 2024-02-16 (×3): 60 mg via SUBCUTANEOUS
  Filled 2024-02-14 (×3): qty 0.6

## 2024-02-14 MED ORDER — PRAVASTATIN SODIUM 20 MG PO TABS
10.0000 mg | ORAL_TABLET | Freq: Every day | ORAL | Status: DC
  Administered 2024-02-14 – 2024-02-16 (×3): 10 mg via ORAL
  Filled 2024-02-14 (×3): qty 1

## 2024-02-14 MED ORDER — AMLODIPINE BESYLATE 10 MG PO TABS
10.0000 mg | ORAL_TABLET | Freq: Every day | ORAL | Status: DC
  Administered 2024-02-14 – 2024-02-17 (×4): 10 mg via ORAL
  Filled 2024-02-14 (×4): qty 1

## 2024-02-14 MED ORDER — ACETAMINOPHEN 325 MG PO TABS: 650.0000 mg | ORAL_TABLET | Freq: Four times a day (QID) | ORAL | Status: DC | PRN

## 2024-02-14 MED ORDER — DOXYCYCLINE HYCLATE 100 MG PO TABS
100.0000 mg | ORAL_TABLET | Freq: Once | ORAL | Status: AC
  Administered 2024-02-14: 100 mg via ORAL
  Filled 2024-02-14: qty 1

## 2024-02-14 MED ORDER — NICOTINE 14 MG/24HR TD PT24
14.0000 mg | MEDICATED_PATCH | Freq: Every day | TRANSDERMAL | Status: DC
  Administered 2024-02-14 – 2024-02-17 (×4): 14 mg via TRANSDERMAL
  Filled 2024-02-14 (×4): qty 1

## 2024-02-14 MED ORDER — PREDNISONE 20 MG PO TABS
40.0000 mg | ORAL_TABLET | Freq: Every day | ORAL | Status: DC
  Administered 2024-02-15 – 2024-02-17 (×3): 40 mg via ORAL
  Filled 2024-02-14 (×3): qty 2

## 2024-02-14 MED ORDER — IPRATROPIUM-ALBUTEROL 0.5-2.5 (3) MG/3ML IN SOLN
9.0000 mL | Freq: Once | RESPIRATORY_TRACT | Status: AC
  Administered 2024-02-14: 9 mL via RESPIRATORY_TRACT
  Filled 2024-02-14: qty 9

## 2024-02-14 MED ORDER — BUSPIRONE HCL 10 MG PO TABS
15.0000 mg | ORAL_TABLET | Freq: Every day | ORAL | Status: DC
Start: 2024-02-14 — End: 2024-02-17
  Administered 2024-02-14 – 2024-02-16 (×3): 15 mg via ORAL
  Filled 2024-02-14: qty 1.5
  Filled 2024-02-14 (×2): qty 2

## 2024-02-14 MED ORDER — PAROXETINE HCL 20 MG PO TABS
20.0000 mg | ORAL_TABLET | Freq: Every day | ORAL | Status: DC
Start: 2024-02-14 — End: 2024-02-14

## 2024-02-14 MED ORDER — SENNOSIDES-DOCUSATE SODIUM 8.6-50 MG PO TABS
1.0000 | ORAL_TABLET | Freq: Every evening | ORAL | Status: DC | PRN
  Administered 2024-02-15: 1 via ORAL
  Filled 2024-02-14: qty 1

## 2024-02-14 MED ORDER — PNEUMOCOCCAL 20-VAL CONJ VACC 0.5 ML IM SUSY
0.5000 mL | PREFILLED_SYRINGE | INTRAMUSCULAR | Status: AC
  Administered 2024-02-15: 0.5 mL via INTRAMUSCULAR
  Filled 2024-02-14: qty 0.5

## 2024-02-14 MED ORDER — ASPIRIN 81 MG PO TBEC
81.0000 mg | DELAYED_RELEASE_TABLET | Freq: Every day | ORAL | Status: DC
  Administered 2024-02-14 – 2024-02-17 (×4): 81 mg via ORAL
  Filled 2024-02-14 (×4): qty 1

## 2024-02-14 MED ORDER — IPRATROPIUM-ALBUTEROL 0.5-2.5 (3) MG/3ML IN SOLN
3.0000 mL | RESPIRATORY_TRACT | Status: DC
  Administered 2024-02-14: 3 mL via RESPIRATORY_TRACT
  Filled 2024-02-14: qty 3

## 2024-02-14 MED ORDER — SODIUM CHLORIDE 0.9 % IV SOLN
2.0000 g | Freq: Once | INTRAVENOUS | Status: AC
  Administered 2024-02-14: 2 g via INTRAVENOUS
  Filled 2024-02-14: qty 20

## 2024-02-14 MED ORDER — DOXYCYCLINE HYCLATE 100 MG PO TABS
100.0000 mg | ORAL_TABLET | Freq: Two times a day (BID) | ORAL | Status: DC
  Administered 2024-02-14 – 2024-02-16 (×4): 100 mg via ORAL
  Filled 2024-02-14 (×4): qty 1

## 2024-02-14 MED ORDER — METHYLPREDNISOLONE SODIUM SUCC 125 MG IJ SOLR
125.0000 mg | Freq: Once | INTRAMUSCULAR | Status: AC
  Administered 2024-02-14: 125 mg via INTRAVENOUS
  Filled 2024-02-14: qty 2

## 2024-02-14 MED ORDER — PAROXETINE HCL 30 MG PO TABS
30.0000 mg | ORAL_TABLET | Freq: Every day | ORAL | Status: DC
Start: 2024-02-14 — End: 2024-02-14

## 2024-02-14 MED ORDER — CARVEDILOL 6.25 MG PO TABS
12.5000 mg | ORAL_TABLET | Freq: Two times a day (BID) | ORAL | Status: DC
  Administered 2024-02-14 – 2024-02-17 (×6): 12.5 mg via ORAL
  Filled 2024-02-14 (×6): qty 2

## 2024-02-14 MED ORDER — BUDESONIDE 0.5 MG/2ML IN SUSP
0.5000 mg | Freq: Two times a day (BID) | RESPIRATORY_TRACT | Status: DC
  Administered 2024-02-14 – 2024-02-17 (×6): 0.5 mg via RESPIRATORY_TRACT
  Filled 2024-02-14 (×6): qty 2

## 2024-02-14 MED ORDER — ACETAMINOPHEN 650 MG RE SUPP: 650.0000 mg | Freq: Four times a day (QID) | RECTAL | Status: DC | PRN

## 2024-02-14 MED ORDER — INFLUENZA VIRUS VACC SPLIT PF (FLUZONE) 0.5 ML IM SUSY
0.5000 mL | PREFILLED_SYRINGE | INTRAMUSCULAR | Status: AC
  Administered 2024-02-15: 0.5 mL via INTRAMUSCULAR
  Filled 2024-02-14: qty 0.5

## 2024-02-14 MED ORDER — GUAIFENESIN 100 MG/5ML PO LIQD
5.0000 mL | ORAL | Status: DC | PRN
  Administered 2024-02-14: 5 mL via ORAL
  Filled 2024-02-14: qty 10

## 2024-02-14 NOTE — ED Triage Notes (Signed)
 Pt arrives from [facility] with SOB that started yesterday. Pt was 86% at room air. Once pt removed mask, came up to 89% and was then placed on 2L [place] and came up to 94%. Pt has been fighting URI for about a week. Pt is A&Ox4 and ambulatory during triage.

## 2024-02-14 NOTE — Progress Notes (Signed)
 PHARMACIST - PHYSICIAN COMMUNICATION  CONCERNING:  Enoxaparin  (Lovenox ) for DVT Prophylaxis   ASSESSMENT: Patient was prescribed enoxaparin  40 mg subcutaneously every 24 hours for VTE prophylaxis.   Body mass index is 36.15 kg/m.  Estimated Creatinine Clearance: 143.9 mL/min (by C-G formula based on SCr of 0.83 mg/dL).  Based on [facility] policy, patient qualifies for enoxaparin  dosing of 0.5 mg per kilogram of total body weight every 24 hours because their body mass index is >30 kg/m2.  PLAN: Pharmacy has adjusted enoxaparin  dose per [facility] policy.  Description: Patient is now receiving enoxaparin  60 mg subcutaneously every 24 hours.  Will M. Lenon, PharmD, BCPS Clinical Pharmacist 02/14/2024 5:05 PM

## 2024-02-14 NOTE — ED Triage Notes (Signed)
 First Nurse Note: Patient to ED from [facility] for SOB- exposed to the flu at work. States O2 was in the 80's initially at [facility] and came up to mid 90's after taking deep breaths.

## 2024-02-14 NOTE — ED Provider Notes (Signed)
 [facility] Provider Note    Event Date/Time   First MD Initiated Contact with Patient 02/14/24 1504     (approximate)   History   Shortness of Breath   HPI  Tony Gibson is a 53 y.o. male past medical history significant for hypertension, hyperlipidemia, obesity, tobacco use, anxiety and depression presents to the emergency department shortness of breath, cough and hypoxia.  Patient states that he currently smokes 1.5 packs of cigarettes a day.  States he did 1 prior admission last year for hypoxia with an upper respiratory virus.  States that he has had an upper respiratory virus over the past week.  Started out as a sinus infection with some cough and congestion and lots of sinus drainage.  States that he is now having significant shortness of breath and cough.  Denies any chest pain.  No swelling to his legs.  Denies any fever.  Denies nausea, vomiting or diaphoresis.  No history of DVT or PE.  No recent travel.  Not on anticoagulation.  States that he takes a daily aspirin .     Physical Exam   Triage Vital Signs: ED Triage Vitals  Encounter Vitals Group     BP 02/14/24 1326 (!) 159/82     Girls Systolic BP Percentile --      Girls Diastolic BP Percentile --      Boys Systolic BP Percentile --      Boys Diastolic BP Percentile --      Pulse Rate 02/14/24 1326 75     Resp 02/14/24 1326 16     Temp 02/14/24 1326 98.2 F (36.8 C)     Temp Source 02/14/24 1326 Oral     SpO2 02/14/24 1326 (!) 86 %     Weight 02/14/24 1330 274 lb (124.3 kg)     Height 02/14/24 1330 6' 1 (1.854 m)     Head Circumference --      Peak Flow --      Pain Score 02/14/24 1326 0     Pain Loc --      Pain Education --      Exclude from Growth Chart --     Most recent vital signs: Vitals:   02/14/24 1327 02/14/24 1449  BP:    Pulse:    Resp:    Temp:    SpO2: 94% 90%    Physical Exam Constitutional:      Appearance: He is well-developed.  HENT:     Head:  Atraumatic.  Eyes:     Conjunctiva/sclera: Conjunctivae normal.  Cardiovascular:     Rate and Rhythm: Regular rhythm.  Pulmonary:     Effort: Tachypnea present. No respiratory distress.     Breath sounds: Wheezing and rhonchi present.     Comments: Hypoxic to 86% requiring 2 L nasal cannula with improvement to 92% Chest:     Chest wall: No tenderness.  Abdominal:     Palpations: Abdomen is soft.     Tenderness: There is no abdominal tenderness.  Musculoskeletal:     Cervical back: Normal range of motion.     Right lower leg: No edema.     Left lower leg: No edema.     Comments: No unilateral leg swelling  Skin:    General: Skin is warm.     Capillary Refill: Capillary refill takes less than 2 seconds.  Neurological:     General: No focal deficit present.     Mental Status: He is  alert. Mental status is at baseline.     IMPRESSION / MDM / ASSESSMENT AND PLAN / ED COURSE  I reviewed the triage vital signs and the nursing notes.  On arrival patient is afebrile and hemodynamically stable but hypoxic to 86% on room air.  Placed on 2 L nasal cannula.  Differential diagnosis including COPD that has been undiagnosed, viral illness, COVID/influenza, pneumonia, pulmonary edema, ACS, anemia   EKG  I, Clotilda Punter, the attending physician, personally viewed and interpreted this ECG.  EKG with normal sinus rhythm.  Heart rate 76.  Normal intervals.  No chamber enlargement.  No significant ST elevation or depression.  No findings of acute ischemia or dysrhythmia.  No tachycardic or bradycardic dysrhythmias while on cardiac telemetry.  RADIOLOGY I independently reviewed imaging, my interpretation of imaging: Chest x-ray with no obvious findings of pneumonia  LABS (all labs ordered are listed, but only abnormal results are displayed) Labs interpreted as -    Labs Reviewed  BASIC METABOLIC PANEL WITH GFR - Abnormal; Notable for the following components:      Result Value    Chloride 94 (*)    Glucose, Bld 118 (*)    Anion gap 16 (*)    All other components within normal limits  CBC - Abnormal; Notable for the following components:   Hemoglobin 17.2 (*)    HCT 52.9 (*)    All other components within normal limits  RESP PANEL BY RT-PCR (RSV, FLU A&B, COVID)  [id number]     MDM  Patient's lab work overall reassuring with no significant leukocytosis.  Does have an elevated hemoglobin level likely in the setting of tobacco use.  No altered mental status.  Patient does have a mildly elevated anion gap.  Creatinine appears to be at his baseline with no significant electrolyte abnormalities.  Glucose is 118.  No history of diabetes.  Clinic picture is most consistent with a COPD exacerbation.  Likely undiagnosed but does have as needed albuterol  inhaler.  Chest x-ray with no obvious findings consistent with pneumonia.  COVID and influenza testing obtained but currently pending.  Patient given DuoNeb treatment and started on IV Solu-Medrol .  Given concern for COPD exacerbation will start on doxycycline and Rocephin .  Concern for possible atypical pneumonia.  Consulted hospitalist for admission for acute hypoxic respiratory failure in the setting of COPD.     PROCEDURES:  Critical Care performed: No  Procedures  Patient's presentation is most consistent with acute presentation with potential threat to life or bodily function.   MEDICATIONS ORDERED IN ED: Medications  ipratropium-albuterol  (DUONEB) 0.5-2.5 (3) MG/3ML nebulizer solution 9 mL (has no administration in time range)  methylPREDNISolone  sodium succinate (SOLU-MEDROL ) 125 mg/2 mL injection 125 mg (has no administration in time range)  doxycycline (VIBRA-TABS) tablet 100 mg (has no administration in time range)  cefTRIAXone  (ROCEPHIN ) 2 g in sodium chloride  0.9 % 100 mL IVPB (has no administration in time range)    FINAL CLINICAL IMPRESSION(S) / ED DIAGNOSES   Final diagnoses:  COPD exacerbation  (HCC)  Hypoxia     Rx / DC Orders   ED Discharge Orders     None        Note:  This document was prepared using Dragon voice recognition software and may include unintentional dictation errors.   Punter Clotilda, MD 02/14/24 1537

## 2024-02-14 NOTE — H&P (Signed)
 History and Physical    Tony Gibson FMW:[id number] DOB: 1970-06-23 DOA: 02/14/2024  DOS: the patient was seen and examined on 02/14/2024  PCP: Alla Amis, MD   Patient coming from: Home  I have personally briefly reviewed patient's old medical records in [facility] and CareEverywhere  HPI:   Tony Gibson is a 53 y.o. year old male with past medical history of HTN, prediabetes, MDD, and GAD for worsening shortness of breath. Pt states he had sinus congestion and chest congestion over the last 5 days and his breathing flared up today prompting him to come to the ED. He denies other symptoms such as myalgias, GI symptoms or fevers or chills. He states no one in the family is sick but multiple co-workers are sick with sinus issues.   ED Course: On arrival to the ED patient was noted to be HDS stable. RSV/COVID/Flu was negative. CXR was negative. EKG and troponin was normal. Pt given duonebs and solumedrol. He was placed on 2 L of supplemental oxygen.   TRH contacted for admission.  Review of Systems: As mentioned in the history of present illness. All other systems reviewed and are negative.   Past Medical History:  Diagnosis Date   Anxiety    Depression    History of alcohol dependence (HCC)    HLD (hyperlipidemia)    Hypertension    Obesity (BMI 30.0-34.9)    Tobacco dependence     Past Surgical History:  Procedure Laterality Date   APPENDECTOMY     BACK SURGERY     COLONOSCOPY N/A 06/25/2020   Procedure: COLONOSCOPY;  Surgeon: Maryruth Ole DASEN, MD;  Location: [facility] ENDOSCOPY;  Service: Endoscopy;  Laterality: N/A;   COLONOSCOPY N/A 01/30/2022   Procedure: COLONOSCOPY;  Surgeon: Maryruth Ole DASEN, MD;  Location: [facility] ENDOSCOPY;  Service: Endoscopy;  Laterality: N/A;   ELBOW SURGERY     LUMBAR LAMINECTOMY/DECOMPRESSION MICRODISCECTOMY Left 06/27/2018   Procedure: LUMBAR HEMI LAMINECTOMY MICRODISCECTOMY 2 LEVELS LEFT L4/5, LEFT L5/S1;  Surgeon:  Bluford Standing, MD;  Location: [facility] ORS;  Service: Neurosurgery;  Laterality: Left;     Allergies  Allergen Reactions   Enalapril Swelling    Throat, mouth, and feet swelling   Zithromax [Azithromycin] Swelling    Facial swelling   Losartan  Potassium Swelling    History reviewed. No pertinent family history.  Prior to Admission medications   Medication Sig Start Date End Date Taking? Authorizing Provider  acetaminophen  (TYLENOL ) 650 MG CR tablet Take 650 mg by mouth every 4 (four) hours as needed for pain.   Yes [provider]  albuterol  (VENTOLIN  HFA) 108 (90 Base) MCG/ACT inhaler Inhale 2 puffs into the lungs every 4 (four) hours as needed for wheezing. 05/21/16  Yes [provider]  amLODipine  (NORVASC ) 10 MG tablet Take 10 mg by mouth daily. 03/25/18  Yes [provider]  aspirin  EC 81 MG tablet Take 81 mg by mouth daily. Swallow whole.   Yes [provider]  busPIRone  (BUSPAR ) 15 MG tablet Take 15 mg by mouth daily.   Yes [provider]  carvedilol  (COREG ) 12.5 MG tablet Take 12.5 mg by mouth 2 (two) times daily with a meal.   Yes [provider]  hydrochlorothiazide  (HYDRODIURIL ) 25 MG tablet Take 25 mg by mouth daily. 12/11/16  Yes [provider]  lovastatin (MEVACOR) 10 MG tablet Take 10 mg by mouth at bedtime. 01/08/17  Yes [provider]  metFORMIN (GLUCOPHAGE-XR) 500 MG 24 hr tablet Take 500  mg by mouth. Once daily with dinner 01/11/24  Yes [provider]  nicotine  (NICODERM CQ  - DOSED IN MG/24 HOURS) 14 mg/24hr patch Place 1 patch (14 mg total) onto the skin daily. 05/10/22  Yes Laurita Pillion, MD  PARoxetine  (PAXIL ) 20 MG tablet Take 20 mg by mouth daily. 02/01/24  Yes [provider]  PARoxetine  (PAXIL ) 30 MG tablet Take 30 mg by mouth daily. 02/01/24  Yes [provider]  busPIRone  (BUSPAR ) 5 MG tablet Take 5 mg by mouth 2 (two) times daily. Patient not taking: Reported on  02/14/2024 01/17/18   [provider]  Chlorphen-Pseudoephed-APAP (CORICIDIN D PO) Take 1 tablet by mouth every 6 (six) hours as needed (for sinus congestion).    [provider]  famotidine  (PEPCID ) 20 MG tablet Take 1 tablet (20 mg total) by mouth 2 (two) times daily. Take 1 pill once in the morning tomorrow. Patient not taking: Reported on 02/14/2024 10/31/18 10/31/19  Alain Deward FALCON, MD  fluticasone -salmeterol (ADVAIR HFA) 230-21 MCG/ACT inhaler Inhale 2 puffs into the lungs 2 (two) times daily. Patient not taking: Reported on 02/14/2024 05/09/22   Laurita Pillion, MD  gabapentin  (NEURONTIN ) 300 MG capsule Take 300 mg by mouth 3 (three) times daily. Patient not taking: Reported on 02/14/2024    [provider]  metFORMIN (GLUCOPHAGE) 500 MG tablet Take 500 mg by mouth 1 day or 1 dose. Patient not taking: Reported on 02/14/2024    [provider]  PARoxetine  (PAXIL ) 40 MG tablet Take 40 mg by mouth at bedtime.  Patient not taking: Reported on 02/14/2024 12/29/16   [provider]      reports that he has been smoking cigarettes. He has a 30 pack-year smoking history. He has never used smokeless tobacco. He reports current alcohol use of about 24.0 standard drinks of alcohol per week. He reports that he does not use drugs. Lives with wife and 2 kids Currently works as maintenance Tobacco- 1.5 ppd 30 pack years EtOH- 2 beers daily Illicit drug use- denies use.  IADLs/ADLs- can person independently at baseline    Physical Exam: Vitals:   02/14/24 1449 02/14/24 1530 02/14/24 1600 02/14/24 1743  BP:  120/69 139/81 (!) 164/89  Pulse:  71 70 70  Resp:  14 15   Temp:    98.1 F (36.7 C)  TempSrc:      SpO2: 90% 92% 90% 94%  Weight:      Height:         Gen: NAD HENT: [place] present, NCAT CV: RRR, good pulses in all extremities Resp: decreased breath sounds diffusely, mild wheeze heard Abd: No TTP, normal bowel sounds MSK: no asymmetry Skin: no  lesions Neuro: alert and oriented x4 Psych: normal mood   Labs on Admission: I have personally reviewed following labs and imaging studies  CBC: Recent Labs  Lab 02/14/24 1332  WBC 8.7  HGB 17.2*  HCT 52.9*  MCV 97.4  PLT 231   Basic Metabolic Panel: Recent Labs  Lab 02/14/24 1332  NA 137  K 3.8  CL 94*  CO2 27  GLUCOSE 118*  BUN 10  CREATININE 0.83  CALCIUM 9.0   GFR: Estimated Creatinine Clearance: 143.9 mL/min (by C-G formula based on SCr of 0.83 mg/dL). Liver Function Tests: No results for input(s): AST, ALT, ALKPHOS, BILITOT, PROT, ALBUMIN in the last 168 hours. No results for input(s): LIPASE, AMYLASE in the last 168 hours. No results for input(s): AMMONIA in the last 168 hours.  Coagulation Profile: No results for input(s): INR, PROTIME in the last 168 hours. Cardiac Enzymes: No results for input(s): CKTOTAL, CKMB, CKMBINDEX, TROPONINI, TROPONINIHS in the last 168 hours. BNP (last 3 results) No results for input(s): BNP in the last 8760 hours. HbA1C: No results for input(s): HGBA1C in the last 72 hours. CBG: No results for input(s): GLUCAP in the last 168 hours. Lipid Profile: No results for input(s): CHOL, HDL, LDLCALC, TRIG, CHOLHDL, LDLDIRECT in the last 72 hours. Thyroid Function Tests: No results for input(s): TSH, T4TOTAL, FREET4, T3FREE, THYROIDAB in the last 72 hours. Anemia Panel: No results for input(s): VITAMINB12, FOLATE, FERRITIN, TIBC, IRON, RETICCTPCT in the last 72 hours. Urine analysis:    Component Value Date/Time   COLORURINE YELLOW (A) 06/24/2018 1425   APPEARANCEUR TURBID (A) 06/24/2018 1425   LABSPEC 1.028 06/24/2018 1425   PHURINE 5.0 06/24/2018 1425   GLUCOSEU NEGATIVE 06/24/2018 1425   HGBUR NEGATIVE 06/24/2018 1425   BILIRUBINUR NEGATIVE 06/24/2018 1425   KETONESUR NEGATIVE 06/24/2018 1425   PROTEINUR NEGATIVE 06/24/2018 1425   NITRITE NEGATIVE  06/24/2018 1425   LEUKOCYTESUR NEGATIVE 06/24/2018 1425    Radiological Exams on Admission: I have personally reviewed images DG Chest 2 View Result Date: 02/14/2024 CLINICAL DATA:  Shortness of breath EXAM: DG CHEST 2V COMPARISON:  05/07/2022 FINDINGS: Frontal and lateral views of the chest demonstrate an unremarkable cardiac silhouette. No acute airspace disease, effusion, or pneumothorax. No acute bony abnormalities. IMPRESSION: 1. No acute intrathoracic process. Electronically Signed   By: Ozell Daring M.D.   On: 02/14/2024 14:57    EKG: My personal interpretation of EKG shows: normal sinus rhythm without acute ST changes. It showed incomplete RBBB but that was present previously.     Assessment/Plan Principal Problem:   Acute hypoxic respiratory failure (HCC) Active Problems:   COPD exacerbation (HCC)   Tobacco use disorder   GAD (generalized anxiety disorder)   Essential hypertension   MDD (major depressive disorder)  AHRF 2/2 COPD Exacerbation Pt with cardinal symptoms of COPD exacerbation. Pt status post nebulizers, IV magnesium , and Solumedrol. No hx of COPD but pt undergoing work up of this by his PCP.  - Start prednisone  40mg  for 5 day course - Doxycycline 100 mg BID for 5 days - DuoNebs q6hr, pulmicort neb - Keep O2 sat >88 - Wean off Oxygen if able - May need to be discharged with LAMA/LABA combo until PCP follow up. Needs PFT outpatient   Tobacco use disorder: advised on cessation. Pt agreeable to NRT and would like chantix at discharge.  Chronic Problems HTN: continue home meds HLD: continue home meds CHF: Continue home meds Prediabetes: hold home med, may get hyperglycemic with steroids but will hold SSI.  MDD/GAD: continue home meds   VTE prophylaxis:  Lovenox   Diet: Regular Code Status:  Full Code Telemetry:  Admission status: Observation, Med-Surg Patient is from: Home  Anticipated d/c is to: Home  Anticipated d/c is in: 1 days   Family  Communication: Updated at bedside   Consults called: None    Severity of Illness: The appropriate patient status for this patient is OBSERVATION. Observation status is judged to be reasonable and necessary in order to provide the required intensity of service to ensure the patient's safety. The patient's presenting symptoms, physical exam findings, and initial radiographic and laboratory data in the context of their medical condition is felt to place them at decreased risk for further clinical deterioration. Furthermore, it is anticipated that the patient will be  medically stable for discharge from the hospital within 2 midnights of admission.    Morene Bathe, MD [facility] DEL. [facility]

## 2024-02-14 NOTE — ED Notes (Signed)
 Called lab to add on troponin

## 2024-02-15 ENCOUNTER — Observation Stay: Admit: 2024-02-15 | Discharge: 2024-02-15 | Disposition: A | Attending: Family Medicine

## 2024-02-15 ENCOUNTER — Telehealth (HOSPITAL_COMMUNITY): Payer: Self-pay | Admitting: Pharmacy Technician

## 2024-02-15 ENCOUNTER — Other Ambulatory Visit (HOSPITAL_COMMUNITY): Payer: Self-pay

## 2024-02-15 DIAGNOSIS — F329 Major depressive disorder, single episode, unspecified: Secondary | ICD-10-CM | POA: Diagnosis not present

## 2024-02-15 DIAGNOSIS — F17200 Nicotine dependence, unspecified, uncomplicated: Secondary | ICD-10-CM | POA: Diagnosis not present

## 2024-02-15 DIAGNOSIS — I10 Essential (primary) hypertension: Secondary | ICD-10-CM

## 2024-02-15 DIAGNOSIS — F411 Generalized anxiety disorder: Secondary | ICD-10-CM

## 2024-02-15 DIAGNOSIS — J9601 Acute respiratory failure with hypoxia: Secondary | ICD-10-CM | POA: Diagnosis not present

## 2024-02-15 DIAGNOSIS — J441 Chronic obstructive pulmonary disease with (acute) exacerbation: Secondary | ICD-10-CM

## 2024-02-15 LAB — BASIC METABOLIC PANEL WITH GFR
Anion gap: 10 (ref 5–15)
BUN: 14 mg/dL (ref 6–20)
CO2: 29 mmol/L (ref 22–32)
Calcium: 9 mg/dL (ref 8.9–10.3)
Chloride: 97 mmol/L — ABNORMAL LOW (ref 98–111)
Creatinine, Ser: 0.69 mg/dL (ref 0.61–1.24)
GFR, Estimated: >60 mL/min (ref >60)
Glucose, Bld: 212 mg/dL — ABNORMAL HIGH (ref 70–99)
Potassium: 4 mmol/L (ref 3.5–5.1)
Sodium: 136 mmol/L (ref 135–145)

## 2024-02-15 LAB — CBC
HCT: 52.1 % — ABNORMAL HIGH (ref 39.0–52.0)
Hemoglobin: 16.8 g/dL (ref 13.0–17.0)
MCH: 31.2 pg (ref 26.0–34.0)
MCHC: 32.2 g/dL (ref 30.0–36.0)
MCV: 96.7 fL (ref 80.0–100.0)
Platelets: 237 K/uL (ref 150–400)
RBC: 5.39 MIL/uL (ref 4.22–5.81)
RDW: 12.5 % (ref 11.5–15.5)
WBC: 9.8 K/uL (ref 4.0–10.5)
nRBC: 0 % (ref 0.0–0.2)

## 2024-02-15 LAB — HEMOGLOBIN A1C
Hgb A1c MFr Bld: 6.4 % — ABNORMAL HIGH (ref 4.8–5.6)
Mean Plasma Glucose: 136.98 mg/dL

## 2024-02-15 MED ORDER — LORAZEPAM 2 MG/ML IJ SOLN: 1.0000 mg | INTRAMUSCULAR | Status: DC | PRN

## 2024-02-15 MED ORDER — ADULT MULTIVITAMIN W/MINERALS CH
1.0000 | ORAL_TABLET | Freq: Every day | ORAL | Status: DC
  Administered 2024-02-15 – 2024-02-16 (×2): 1 via ORAL
  Filled 2024-02-15 (×2): qty 1

## 2024-02-15 MED ORDER — HYDRALAZINE HCL 20 MG/ML IJ SOLN: 10.0000 mg | Freq: Four times a day (QID) | INTRAMUSCULAR | Status: DC | PRN

## 2024-02-15 MED ORDER — IPRATROPIUM-ALBUTEROL 0.5-2.5 (3) MG/3ML IN SOLN
3.0000 mL | Freq: Two times a day (BID) | RESPIRATORY_TRACT | Status: DC
  Administered 2024-02-15 – 2024-02-17 (×4): 3 mL via RESPIRATORY_TRACT
  Filled 2024-02-15 (×4): qty 3

## 2024-02-15 MED ORDER — LABETALOL HCL 5 MG/ML IV SOLN: 10.0000 mg | INTRAVENOUS | Status: DC | PRN

## 2024-02-15 MED ORDER — LORAZEPAM 1 MG PO TABS: 1.0000 mg | ORAL_TABLET | ORAL | Status: DC | PRN

## 2024-02-15 MED ORDER — MUSCLE RUB 10-15 % EX CREA: 1.0000 | TOPICAL_CREAM | CUTANEOUS | Status: DC | PRN

## 2024-02-15 MED ORDER — THIAMINE HCL 100 MG/ML IJ SOLN
100.0000 mg | Freq: Every day | INTRAMUSCULAR | Status: DC
  Filled 2024-02-15: qty 2

## 2024-02-15 MED ORDER — FOLIC ACID 1 MG PO TABS
1.0000 mg | ORAL_TABLET | Freq: Every day | ORAL | Status: DC
  Administered 2024-02-15 – 2024-02-17 (×3): 1 mg via ORAL
  Filled 2024-02-15 (×3): qty 1

## 2024-02-15 MED ORDER — FLUTICASONE PROPIONATE 50 MCG/ACT NA SUSP
2.0000 | Freq: Every day | NASAL | Status: DC
  Administered 2024-02-15 – 2024-02-17 (×3): 2 via NASAL
  Filled 2024-02-15: qty 16

## 2024-02-15 MED ORDER — SALINE SPRAY 0.65 % NA SOLN: 1.0000 | NASAL | Status: DC | PRN

## 2024-02-15 MED ORDER — THIAMINE MONONITRATE 100 MG PO TABS
100.0000 mg | ORAL_TABLET | Freq: Every day | ORAL | Status: DC
Start: 2024-02-15 — End: 2024-02-17
  Administered 2024-02-15 – 2024-02-17 (×3): 100 mg via ORAL
  Filled 2024-02-15 (×3): qty 1

## 2024-02-15 MED ORDER — DM-GUAIFENESIN ER 30-600 MG PO TB12
1.0000 | ORAL_TABLET | Freq: Two times a day (BID) | ORAL | Status: DC
  Administered 2024-02-15 – 2024-02-17 (×5): 1 via ORAL
  Filled 2024-02-15 (×5): qty 1

## 2024-02-15 NOTE — Plan of Care (Signed)
  Problem: Education: Goal: Knowledge of General Education information will improve Description: Including pain rating scale, medication(s)/side effects and non-pharmacologic comfort measures Outcome: Progressing   Problem: Health Behavior/Discharge Planning: Goal: Ability to manage health-related needs will improve Outcome: Progressing   Problem: Nutrition: Goal: Adequate nutrition will be maintained Outcome: Progressing   Problem: Coping: Goal: Level of anxiety will decrease Outcome: Progressing   Problem: Pain Managment: Goal: General experience of comfort will improve and/or be controlled Outcome: Progressing   Problem: Safety: Goal: Ability to remain free from injury will improve Outcome: Progressing   Problem: Skin Integrity: Goal: Risk for impaired skin integrity will decrease Outcome: Progressing

## 2024-02-15 NOTE — Telephone Encounter (Signed)
 Patient Product/process development scientist completed.    The patient is insured through [facility]. Patient has [employer], may use a copay card, and/or apply for patient assistance if available.    Ran test claim for Anoro Ellipta and the current 30 day co-pay is $100.00.   This test claim was processed through [facility]- copay amounts may vary at other pharmacies due to pharmacy/plan contracts, or as the patient moves through the different stages of their insurance plan.     Reyes Sharps, CPHT Pharmacy Technician Patient Advocate Specialist Lead [facility] Patient Advocate Team Direct Number: [phone]  Fax: [phone]

## 2024-02-15 NOTE — Plan of Care (Signed)
  Problem: Clinical Measurements: Goal: Ability to maintain clinical measurements within normal limits will improve Outcome: Progressing   Problem: Activity: Goal: Risk for activity intolerance will decrease Outcome: Progressing   Problem: Elimination: Goal: Will not experience complications related to bowel motility Outcome: Progressing   Problem: Safety: Goal: Ability to remain free from injury will improve Outcome: Progressing   Problem: Pain Managment: Goal: General experience of comfort will improve and/or be controlled Outcome: Progressing

## 2024-02-15 NOTE — Progress Notes (Signed)
 SATURATION QUALIFICATIONS: (This note is used to comply with regulatory documentation for home oxygen)  Patient Saturations on Room Air at Rest = 88%  Patient Saturations on Room Air while Ambulating = 80%  Patient Saturations on 2 Liters of oxygen while Ambulating = 84%  Please briefly explain why patient needs home oxygen:

## 2024-02-15 NOTE — Progress Notes (Addendum)
 PROGRESS NOTE    Tony Gibson  FMW:[id number] DOB: June 14, 1970 DOA: 02/14/2024 PCP: Alla Amis, MD  Chief Complaint  Patient presents with   Shortness of Breath    Hospital Course:  Tony Gibson is a 53 year old male with hypertension, prediabetes, MDD, GAD, alcohol abuse, likely COPD, who presents with worsening shortness of breath.  Patient endorses sinus congestion and chest congestion over the last 5 days.  He denies any symptoms of myalgia, GI symptoms, fever.  He does endorse he has multiple sick Acupuncturist.  On arrival to the ED he was hemodynamically stable and respiratory viral panel negative.  CXR unremarkable.  EKG troponin within normal limits.  He was started on Solu-Medrol , DuoNebs, and placed on 2 L supplemental O2.  Patient was gradually weaning off O2 on 10/21 but remained dyspneic and wheezy.  Subjective: This morning patient reports he is starting to feel better.  Still complaining of significant nasal congestion, and reports it is difficult to wear his supplemental O2 due to nasal congestion.  We had extensive discussion regarding his tobacco abuse history and he is aware he needs to discontinue nicotine .  He is open to medical therapies to assist.   Objective: Vitals:   02/15/24 0802 02/15/24 0822 02/15/24 0904 02/15/24 1059  BP: (!) 166/80 (!) 166/80 (!) 166/80   Pulse: 77 77    Resp: 17     Temp: 97.6 F (36.4 C)     TempSrc:      SpO2: 98%   90%  Weight:      Height:        Intake/Output Summary (Last 24 hours) at 02/15/2024 1516 Last data filed at 02/15/2024 1022 Gross per 24 hour  Intake 390 ml  Output --  Net 390 ml   Filed Weights   02/14/24 1330  Weight: 124.3 kg    Examination: General exam: Appears calm and comfortable, NAD  Respiratory system: Dyspneic when speaking, no work of breathing, bilateral end expiratory wheeze Cardiovascular system: S1 & S2 heard, RRR.  Gastrointestinal system: Abdomen is nondistended, soft and  nontender.  Neuro: Alert and oriented. No focal neurological deficits. Extremities: Symmetric, expected ROM Skin: No rashes, lesions Psychiatry: Demonstrates appropriate judgement and insight. Mood & affect appropriate for situation.   Assessment & Plan:  Principal Problem:   Acute hypoxic respiratory failure (HCC) Active Problems:   COPD exacerbation (HCC)   Tobacco use disorder   GAD (generalized anxiety disorder)   Essential hypertension   MDD (major depressive disorder)    COPD exacerbation - Patient does not carry COPD diagnosis at this time but given his extensive tobacco history, responsiveness to nebulizer therapies, and clinical picture, COPD is likely.  He reports his PCP is working this up - Will need pulmonology referral at DC - Respiratory viral panel negative - CXR without infiltrate - Continue with scheduled prednisone  - Continue supplemental O2, wean as tolerated - Walk trial tomorrow if still requiring O2 - Discussed with clinical Pharm.  All LAMA/LABA combos are roughly $100 co-pay.  Patient reports he is willing to pay this, will prescribe Ellipta at DC.  Sinus infection -- Cont doxy - Flonase scheduled - Mucinex  DM scheduled - No fever or leukocytosis to suspect bacterial infection at this time.  Alcohol abuse - Patient reports he was previously a much heavier drinker, and now heavy drinking to the weekends but does still drink 3 beers daily - High risk of withdrawal - CIWA protocol - As needed Ativan - Thiamine, multivitamin, folic acid  Chest pain - Patient denies any chest pain to me but reports he has been having chest pain to his wife - Troponin on arrival 4, unchanged on repeat - EKG on arrival with possible prior infarct - Will proceed with echocardiogram for better evaluation - Already taking aspirin  and statin per PCP  Diabetes - Hemoglobin A1c 7.2 in June 2025 - Has been on metformin, will hold while admitted - Expect hyperglycemia  secondary to steroid therapy - Repeat hemoglobin A1c ordered  GAD Depression disorder - Continue home meds  Tobacco abuse - Extensively counseled on cessation.  Patient is aware and is amendable to quitting.  Has consider Chantix at discharge  Hyperlipidemia - Continue home meds  Hypertension - Continue home meds, BP above goal. - As needed hydralazine and labetalol - Consider additional agent if BP remains uncontrolled, noted history of ARB and ACE allergy.  Aortic atherosclerosis - Previously seen on CT in June 2025 - On aspirin  and statin.  BMI 36 Obesity class II Hepatic steatosis - Outpatient follow up for lifestyle modification and risk factor management   DVT prophylaxis: lovenox    Code Status: Full Code Disposition:  inpatient pending clinical resolution, hopefully DC in AM if stable  Consultants:    Procedures:    Antimicrobials:  Anti-infectives (From admission, onward)    Start     Dose/Rate Route Frequency Ordered Stop   02/14/24 2200  doxycycline (VIBRA-TABS) tablet 100 mg        100 mg Oral Every 12 hours 02/14/24 1825 02/17/24 0959   02/14/24 1545  doxycycline (VIBRA-TABS) tablet 100 mg        100 mg Oral  Once 02/14/24 1531 02/14/24 1541   02/14/24 1545  cefTRIAXone  (ROCEPHIN ) 2 g in sodium chloride  0.9 % 100 mL IVPB        2 g 200 mL/hr over 30 Minutes Intravenous  Once 02/14/24 1531 02/14/24 1612       Data Reviewed: I have personally reviewed following labs and imaging studies CBC: Recent Labs  Lab 02/14/24 1332 02/15/24 0543  WBC 8.7 9.8  HGB 17.2* 16.8  HCT 52.9* 52.1*  MCV 97.4 96.7  PLT 231 237   Basic Metabolic Panel: Recent Labs  Lab 02/14/24 1332 02/14/24 1830 02/15/24 0543  NA 137  --  136  K 3.8  --  4.0  CL 94*  --  97*  CO2 27  --  29  GLUCOSE 118*  --  212*  BUN 10  --  14  CREATININE 0.83  --  0.69  CALCIUM 9.0  --  9.0  MG  --  2.7*  --    GFR: Estimated Creatinine Clearance: 149.3 mL/min (by C-G  formula based on SCr of 0.69 mg/dL). Liver Function Tests: No results for input(s): AST, ALT, ALKPHOS, BILITOT, PROT, ALBUMIN in the last 168 hours. CBG: No results for input(s): GLUCAP in the last 168 hours.  Recent Results (from the past 240 hours)  Resp panel by RT-PCR (RSV, Flu A&B, Covid) Anterior Nasal Swab     Status: None   Collection Time: 02/14/24  3:32 PM   Specimen: Anterior Nasal Swab  Result Value Ref Range Status   SARS Coronavirus 2 by RT PCR NEGATIVE NEGATIVE Final    Comment: (NOTE) SARS-CoV-2 target nucleic acids are NOT DETECTED.  The SARS-CoV-2 RNA is generally detectable in upper respiratory specimens during the acute phase of infection. The lowest concentration of SARS-CoV-2 viral copies this assay can detect is 138  copies/mL. A negative result does not preclude SARS-Cov-2 infection and should not be used as the sole basis for treatment or other patient management decisions. A negative result may occur with  improper specimen collection/handling, submission of specimen other than nasopharyngeal swab, presence of viral mutation(s) within the areas targeted by this assay, and inadequate number of viral copies(<138 copies/mL). A negative result must be combined with clinical observations, patient history, and epidemiological information. The expected result is Negative.  Fact Sheet for Patients:  BloggerCourse.com  Fact Sheet for Healthcare Providers:  SeriousBroker.it  This test is no t yet approved or cleared by the [place]  FDA and  has been authorized for detection and/or diagnosis of SARS-CoV-2 by FDA under an Emergency Use Authorization (EUA). This EUA will remain  in effect (meaning this test can be used) for the duration of the COVID-19 declaration under Section 564(b)(1) of the Act, 21 U.S.C.section 360bbb-3(b)(1), unless the authorization is terminated  or revoked sooner.        Influenza A by PCR NEGATIVE NEGATIVE Final   Influenza B by PCR NEGATIVE NEGATIVE Final    Comment: (NOTE) The Xpert Xpress SARS-CoV-2/FLU/RSV plus assay is intended as an aid in the diagnosis of influenza from Nasopharyngeal swab specimens and should not be used as a sole basis for treatment. Nasal washings and aspirates are unacceptable for Xpert Xpress SARS-CoV-2/FLU/RSV testing.  Fact Sheet for Patients: BloggerCourse.com  Fact Sheet for Healthcare Providers: SeriousBroker.it  This test is not yet approved or cleared by the [place]  FDA and has been authorized for detection and/or diagnosis of SARS-CoV-2 by FDA under an Emergency Use Authorization (EUA). This EUA will remain in effect (meaning this test can be used) for the duration of the COVID-19 declaration under Section 564(b)(1) of the Act, 21 U.S.C. section 360bbb-3(b)(1), unless the authorization is terminated or revoked.     Resp Syncytial Virus by PCR NEGATIVE NEGATIVE Final    Comment: (NOTE) Fact Sheet for Patients: BloggerCourse.com  Fact Sheet for Healthcare Providers: SeriousBroker.it  This test is not yet approved or cleared by the [place]  FDA and has been authorized for detection and/or diagnosis of SARS-CoV-2 by FDA under an Emergency Use Authorization (EUA). This EUA will remain in effect (meaning this test can be used) for the duration of the COVID-19 declaration under Section 564(b)(1) of the Act, 21 U.S.C. section 360bbb-3(b)(1), unless the authorization is terminated or revoked.  Performed at [facility], [address] [place], [place] [address]      Radiology Studies: DG Chest 2 View Result Date: 02/14/2024 CLINICAL DATA:  Shortness of breath EXAM: DG CHEST 2V COMPARISON:  05/07/2022 FINDINGS: Frontal and lateral views of the chest demonstrate an unremarkable cardiac  silhouette. No acute airspace disease, effusion, or pneumothorax. No acute bony abnormalities. IMPRESSION: 1. No acute intrathoracic process. Electronically Signed   By: Ozell Daring M.D.   On: 02/14/2024 14:57    Scheduled Meds:  amLODipine   10 mg Oral Daily   aspirin  EC  81 mg Oral Daily   budesonide (PULMICORT) nebulizer solution  0.5 mg Nebulization BID   busPIRone   15 mg Oral Daily   carvedilol   12.5 mg Oral BID WC   dextromethorphan-guaiFENesin   1 tablet Oral BID   doxycycline  100 mg Oral Q12H   enoxaparin  (LOVENOX ) injection  60 mg Subcutaneous Q24H   fluticasone   2 spray Each Nare Daily   hydrochlorothiazide   25 mg Oral Daily   ipratropium-albuterol   3 mL Nebulization BID  nicotine   14 mg Transdermal Daily   PARoxetine   50 mg Oral Daily   pravastatin   10 mg Oral q1800   predniSONE   40 mg Oral Q breakfast   Continuous Infusions:   LOS: 0 days  MDM: Patient is high risk for one or more organ failure.  They necessitate ongoing hospitalization for continued IV therapies and subsequent lab monitoring. Total time spent interpreting labs and vitals, reviewing the medical record, coordinating care amongst consultants and care team members, directly assessing and discussing care with the patient and/or family: 55 min Tony Vankirk, DO [facility]  To contact the attending physician between 7A-7P please use Epic Chat. To contact the covering physician during after hours 7P-7A, please review Amion.  02/15/2024, 3:16 PM   *This document has been created with the assistance of dictation software. Please excuse typographical errors. *

## 2024-02-16 DIAGNOSIS — J9601 Acute respiratory failure with hypoxia: Secondary | ICD-10-CM | POA: Diagnosis not present

## 2024-02-16 LAB — RESPIRATORY PANEL BY PCR

## 2024-02-16 LAB — ECHOCARDIOGRAM COMPLETE
Area-P 1/2: 4.28 cm2
Height: 73 in
S' Lateral: 3.1 cm
Weight: 4384 [oz_av]

## 2024-02-16 LAB — GLUCOSE, CAPILLARY: Glucose-Capillary: 132 mg/dL — ABNORMAL HIGH (ref 70–99)

## 2024-02-16 NOTE — Progress Notes (Signed)
 PROGRESS NOTE    Tony Gibson  FMW:[id number] DOB: Apr 17, 1971 DOA: 02/14/2024 PCP: Alla Amis, MD  113A/113A-AA  LOS: 0 days   Brief hospital course:   Assessment & Plan: Tony Gibson is a 53 year old male with hypertension, prediabetes, MDD, GAD, alcohol abuse, likely COPD, who presents with worsening shortness of breath.  Patient endorses sinus congestion and chest congestion over the last 5 days.  He denies any symptoms of myalgia, GI symptoms, fever.  He does endorse he has multiple sick Acupuncturist.  On arrival to the ED he was hemodynamically stable and respiratory viral panel negative.  CXR unremarkable.  EKG troponin within normal limits.  He was started on Solu-Medrol , DuoNebs, and placed on 2 L supplemental O2.  Patient was gradually weaning off O2 on 10/21 but remained dyspneic and wheezy.    Acute hypoxemic respiratory failure 2/2 Rhinoviral infection - CXR without infiltrate --RVP today came back pos for Rhino/enterovirus --supportive care --Continue supplemental O2 to keep sats >=90%, wean as tolerated  Likely COPD exacerbation - likely triggered by rhinoviral infection.  Patient does not carry COPD diagnosis at this time but given his extensive tobacco history, responsiveness to nebulizer therapies, and clinical picture, COPD is likely.  He reports his PCP is working this up --cont prednisone  40 mg daily --cont nebs - Will need pulmonology referral at DC - Discussed with clinical Pharm.  All LAMA/LABA combos are roughly $100 co-pay.  Patient reports he is willing to pay this, will prescribe Ellipta at DC.   Sinus infection - No fever or leukocytosis to suspect bacterial infection at this time. --d/c doxy   Alcohol abuse - Patient reports he was previously a much heavier drinker, and now heavy drinking to the weekends but does still drink 3 beers daily - High risk of withdrawal --CIWA - As needed Ativan - Thiamine, multivitamin, folic acid    Chest pain, ACS ruled out - Patient denies any chest pain to me but reports he has been having chest pain to his wife - Troponin on arrival 4, unchanged on repeat --Echo wnl   Diabetes --A1c 6.4 --hold home metformin   GAD Depression disorder --cont Buspar  and Paxil    Tobacco abuse - Extensively counseled on cessation.  Patient is aware and is amendable to quitting.   --nicotine  patch   Hyperlipidemia - Continue home statin   Hypertension - cont amlodipine , coreg , hydrochlorothiazide     Aortic atherosclerosis - Previously seen on CT in June 2025 --cont ASA and statin   BMI 36 Obesity class II Hepatic steatosis - Outpatient follow up for lifestyle modification and risk factor management   DVT prophylaxis: Lovenox  SQ Code Status: Full code  Family Communication: son updated at bedside today Level of care: Med-Surg Dispo:   The patient is from: home Anticipated d/c is to: home Anticipated d/c date is: 1-2 days   Subjective and Interval History:  Pt reported feeling better.  RVP came back pos for Rhino/enterovirus.   Objective: Vitals:   02/16/24 1609 02/16/24 1631 02/16/24 1941 02/16/24 2025  BP: 133/88 (!) 152/88 139/87   Pulse:  72 68   Resp:  16 18   Temp:  98 F (36.7 C) 98.3 F (36.8 C)   TempSrc:  Oral Oral   SpO2:  95% 94% 94%  Weight:      Height:        Intake/Output Summary (Last 24 hours) at 02/16/2024 2126 Last data filed at 02/16/2024 1900 Gross per 24 hour  Intake 720 ml  Output --  Net 720 ml   Filed Weights   02/14/24 1330  Weight: 124.3 kg    Examination:   Constitutional: NAD, AAOx3 HEENT: conjunctivae and lids normal, EOMI CV: No cyanosis.   RESP: normal respiratory effort, on 2L Neuro: II - XII grossly intact.   Psych: Normal mood and affect.  Appropriate judgement and reason   Data Reviewed: I have personally reviewed labs and imaging studies  Time spent: 50 minutes  Ellouise Haber, MD [facility] If  7PM-7AM, please contact night-coverage 02/16/2024, 9:26 PM

## 2024-02-16 NOTE — Progress Notes (Signed)
 Mobility Specialist - Progress Note  Pre-mobility: HR-75,  SpO2-90%  During mobility: HR-74, , SpO2-75% recovered to 86% during ambulation  Post-mobility: HR-74, , SPO2-88%   02/16/24 1200  Oxygen Therapy  SpO2 90 %  O2 Device Room Air  Mobility  Activity Ambulated with assistance;Stood at bedside  Level of Assistance Independent after set-up  Assistive Device None  Distance Ambulated (ft) 185 ft  Range of Motion/Exercises All extremities  Activity Response Tolerated fair  Mobility visit 1 Mobility  Mobility Specialist Start Time (ACUTE ONLY) 0920  Mobility Specialist Stop Time (ACUTE ONLY) [id number]  Mobility Specialist Time Calculation (min) (ACUTE ONLY) 17 min   Pt was supine in bed on RA upon entry. Pt agreed to mobility. Pt was able to get to the EOB independently with no AD. Pt is able to STS independently with no AD. Pt ambulated well. Pt did not need a recovery break during ambulation. After activity pt O2 levels took some time to increase. Pt stated he felt a bit tired but not bad @75 % O2. Pt was able to recover to 89%. Pt was in room in bed supine with needs in reach. Nurse was updated about findings.  Clem Rodes Mobility Specialist 02/16/24, 12:51 PM

## 2024-02-16 NOTE — Plan of Care (Signed)
  Problem: Clinical Measurements: Goal: Ability to maintain clinical measurements within normal limits will improve Outcome: Progressing   Problem: Activity: Goal: Risk for activity intolerance will decrease Outcome: Progressing   Problem: Safety: Goal: Ability to remain free from injury will improve Outcome: Progressing   Problem: Pain Managment: Goal: General experience of comfort will improve and/or be controlled Outcome: Progressing   Problem: Safety: Goal: Ability to remain free from injury will improve Outcome: Progressing   Problem: Skin Integrity: Goal: Risk for impaired skin integrity will decrease Outcome: Progressing   Problem: Coping: Goal: Level of anxiety will decrease Outcome: Progressing

## 2024-02-16 NOTE — Plan of Care (Signed)

## 2024-02-16 NOTE — Progress Notes (Signed)
 Mobility Specialist - Progress Note  Pre-mobility: HR-87,  SpO2-89%  During mobility: HR-92, , SpO2-88% Recovered to 90% <1 min.  Post-mobility: HR-88, , SPO2-91%   02/16/24 1500  Mobility  Activity Ambulated independently  Level of Assistance Independent after set-up  Assistive Device None  Distance Ambulated (ft) 185 ft  Range of Motion/Exercises All extremities  Activity Response Tolerated well  Mobility visit 1 Mobility  Mobility Specialist Start Time (ACUTE ONLY) 1407  Mobility Specialist Stop Time (ACUTE ONLY) 1422  Mobility Specialist Time Calculation (min) (ACUTE ONLY) 15 min   Pt was supine in bed with HOB elevated on O2 @ 2L and guest in the room upon entry. Pt agreed to mobility. Pt was able to get to the EOB independently w/o a AD. Pt O2 vitals were taken throughout activity as a precaution. Pt is able to STS independently. Pt ambulated well. Recovery break was taken as a precaution and to check vitals. After activity pt returned to the room in bed with needs in reach and guest in the room.  Clem Rodes Mobility Specialist 02/16/24, 3:59 PM

## 2024-02-16 NOTE — Progress Notes (Signed)
 Brief Assessment Note:  Patient is inpt with COPD exacerbation. SA resources added to the patients AVS. Medical record reviewed and patient has no additional TOC needs at this time. Please outreach to [facility] if needs are identified.

## 2024-02-17 ENCOUNTER — Other Ambulatory Visit (HOSPITAL_COMMUNITY): Payer: Self-pay

## 2024-02-17 DIAGNOSIS — J9601 Acute respiratory failure with hypoxia: Secondary | ICD-10-CM | POA: Diagnosis not present

## 2024-02-17 MED ORDER — DOXYCYCLINE HYCLATE 100 MG PO TABS
100.0000 mg | ORAL_TABLET | Freq: Two times a day (BID) | ORAL | Status: DC
  Administered 2024-02-17: 100 mg via ORAL
  Filled 2024-02-17: qty 1

## 2024-02-17 MED ORDER — VITAMIN B-1 100 MG PO TABS: 100.0000 mg | ORAL_TABLET | Freq: Every day | ORAL | Status: AC

## 2024-02-17 MED ORDER — FLUTICASONE-SALMETEROL 250-50 MCG/ACT IN AEPB: 1.0000 | INHALATION_SPRAY | Freq: Two times a day (BID) | RESPIRATORY_TRACT | 2 refills | Status: AC

## 2024-02-17 MED ORDER — FOLIC ACID 1 MG PO TABS: 1.0000 mg | ORAL_TABLET | Freq: Every day | ORAL | Status: AC

## 2024-02-17 MED ORDER — DOXYCYCLINE HYCLATE 100 MG PO TABS: 100.0000 mg | ORAL_TABLET | Freq: Two times a day (BID) | ORAL | 0 refills | Status: AC

## 2024-02-17 NOTE — Progress Notes (Signed)
 Patient in room sitting in chair on room air, sat 93%.  Walked x 2 in the hall on room air, O2 sat maintained at 91-92%.  Final round sat drop to 88%, O2 was re-attached at 2L, pt. O2 back to 93%. Dr. Informed. Patient remain comfortable in room

## 2024-02-17 NOTE — Plan of Care (Signed)
  Problem: Clinical Measurements: Goal: Ability to maintain clinical measurements within normal limits will improve Outcome: Progressing   Problem: Activity: Goal: Risk for activity intolerance will decrease Outcome: Progressing   Problem: Pain Managment: Goal: General experience of comfort will improve and/or be controlled Outcome: Progressing   Problem: Elimination: Goal: Will not experience complications related to bowel motility Outcome: Progressing   Problem: Safety: Goal: Ability to remain free from injury will improve 02/17/2024 0246 by Melven Odilia PARAS, RN Outcome: Progressing

## 2024-02-17 NOTE — Progress Notes (Signed)
 Looking for patient , was told patient was taken down to discharge lounge.

## 2024-02-17 NOTE — Discharge Summary (Signed)
 Physician Discharge Summary   Tony Gibson  male DOB: 1971/03/03  FMW:[id number]  PCP: Alla Amis, MD  Admit date: 02/14/2024 Discharge date: 02/17/2024  Admitted From: home Disposition:  home CODE STATUS: Full code   Hospital Course:  For full details, please see H&P, progress notes, consult notes and ancillary notes.  Briefly,  Tony Gibson is a 53 year old male with hypertension, alcohol abuse, likely COPD, who presented with worsening shortness of breath.    Patient endorsed sinus congestion and chest congestion over the last 5 days.  He had multiple sick colleagues.    Acute hypoxemic respiratory failure 2/2 Rhinoviral infection -On presentation, Pt was 86% at room air.  Once pt removed mask, came up to 89% and was then placed on 2L [place] and came up to 94%. --CXR without infiltrate --RVP came back pos for Rhino/enterovirus --On the day of discharge, O2 sats in 90's with 2 laps around nurse's station on room air, desat to 87% on 3rd lap, but could recover with deep breathing.  D/c'ed home without O2.   Likely COPD exacerbation - likely triggered by rhinoviral infection.  Patient does not carry COPD diagnosis at this time but given his extensive tobacco history, responsiveness to nebulizer therapies, and clinical picture, COPD is likely.  He reports his PCP is working this up --received IV solumedrol f/b 3 days of prednisone  40 mg daily.  Received DuoNebs. --Pt not taking Advair HFA PTA.  Discharged on Advair Diskus which is cheaper.   Sinus infection --started on doxy on presentation.  Discharged with 2 moire days to finish 5-day course.   Alcohol abuse - Patient reports he was previously a much heavier drinker, and now heavy drinking to the weekends but does still drink 3 beers daily - Thiamine, folic acid   Chest pain, ACS ruled out - Patient denied any chest pain on presentation but reported he has been having chest pain to his wife - Troponin on  arrival 4, unchanged on repeat --Echo wnl   Diabetes --A1c 6.4 --resume home metformin after discharge   GAD Depression disorder --cont Buspar  --cont home Paxil  50 mg daily   Tobacco abuse - Extensively counseled on cessation.  Patient is aware and is amendable to quitting.   --nicotine  patch   Hyperlipidemia - Continue home statin   Hypertension - cont home amlodipine , coreg , hydrochlorothiazide     Aortic atherosclerosis - Previously seen on CT in June 2025 --cont ASA and statin   BMI 36 Obesity class II Hepatic steatosis - Outpatient follow up for lifestyle modification and risk factor management   Unless noted above, medications under STOP list are ones pt was not taking PTA.  Discharge Diagnoses:  Principal Problem:   Acute hypoxic respiratory failure (HCC) Active Problems:   COPD exacerbation (HCC)   Tobacco use disorder   GAD (generalized anxiety disorder)   Essential hypertension   MDD (major depressive disorder)     Discharge Instructions:  Allergies as of 02/17/2024       Reactions   Enalapril Swelling   Throat, mouth, and feet swelling   Zithromax [azithromycin] Swelling   Facial swelling   Losartan  Potassium Swelling        Medication List     STOP taking these medications    famotidine  20 MG tablet Commonly known as: PEPCID    fluticasone -salmeterol 230-21 MCG/ACT inhaler Commonly known as: Advair HFA Replaced by: fluticasone -salmeterol 250-50 MCG/ACT Aepb   gabapentin  300 MG capsule Commonly known as: NEURONTIN   TAKE these medications    acetaminophen  650 MG CR tablet Commonly known as: TYLENOL  Take 650 mg by mouth every 4 (four) hours as needed for pain.   albuterol  108 (90 Base) MCG/ACT inhaler Commonly known as: VENTOLIN  HFA Inhale 2 puffs into the lungs every 4 (four) hours as needed for wheezing.   amLODipine  10 MG tablet Commonly known as: NORVASC  Take 10 mg by mouth daily.   aspirin  EC 81 MG  tablet Take 81 mg by mouth daily. Swallow whole.   busPIRone  15 MG tablet Commonly known as: BUSPAR  Take 15 mg by mouth daily. What changed: Another medication with the same name was removed. Continue taking this medication, and follow the directions you see here.   carvedilol  12.5 MG tablet Commonly known as: COREG  Take 12.5 mg by mouth 2 (two) times daily with a meal.   CORICIDIN D PO Take 1 tablet by mouth every 6 (six) hours as needed (for sinus congestion).   doxycycline 100 MG tablet Commonly known as: VIBRA-TABS Take 1 tablet (100 mg total) by mouth every 12 (twelve) hours for 2 days.   fluticasone -salmeterol 250-50 MCG/ACT Aepb Commonly known as: ADVAIR Inhale 1 puff into the lungs in the morning and at bedtime. Replaces: fluticasone -salmeterol 230-21 MCG/ACT inhaler   folic acid 1 MG tablet Commonly known as: FOLVITE Take 1 tablet (1 mg total) by mouth daily.   hydrochlorothiazide  25 MG tablet Commonly known as: HYDRODIURIL  Take 25 mg by mouth daily.   lovastatin 10 MG tablet Commonly known as: MEVACOR Take 10 mg by mouth at bedtime.   metFORMIN 500 MG 24 hr tablet Commonly known as: GLUCOPHAGE-XR Take 500 mg by mouth. Once daily with dinner What changed: Another medication with the same name was removed. Continue taking this medication, and follow the directions you see here.   nicotine  14 mg/24hr patch Commonly known as: NICODERM CQ  - dosed in mg/24 hours Place 1 patch (14 mg total) onto the skin daily.   PARoxetine  20 MG tablet Commonly known as: PAXIL  Take 20 mg by mouth daily.   PARoxetine  30 MG tablet Commonly known as: PAXIL  Take 30 mg by mouth daily.   thiamine 100 MG tablet Commonly known as: Vitamin B-1 Take 1 tablet (100 mg total) by mouth daily.         Follow-up Information     Alla Amis, MD Follow up in 1 week(s).   Specialty: Family Medicine Contact information: [address] [facility] [place]  [place] [address] [phone]                 Allergies  Allergen Reactions   Enalapril Swelling    Throat, mouth, and feet swelling   Zithromax [Azithromycin] Swelling    Facial swelling   Losartan  Potassium Swelling     The results of significant diagnostics from this hospitalization (including imaging, microbiology, ancillary and laboratory) are listed below for reference.   Consultations:   Procedures/Studies: ECHOCARDIOGRAM COMPLETE Result Date: 02/16/2024    ECHOCARDIOGRAM REPORT   Patient Name:   Tony Gibson Date of Exam: 02/15/2024 Medical Rec #:  [id number]         Height:       73.0 in Accession #:    [id number]        Weight:       274.0 lb Date of Birth:  Feb 17, 1971         BSA:          2.460 m Patient Age:  53 years          BP:           143/76 mmHg Patient Gender: M                 HR:           87 bpm. Exam Location:  [facility] Procedure: 2D Echo, Cardiac Doppler and Color Doppler (Both Spectral and Color            Flow Doppler were utilized during procedure). Indications:     R07.9 Chest pain  History:         Patient has no prior history of Echocardiogram examinations.                  Risk Factors:Hypertension and Dyslipidemia.  Sonographer:     Carl Coma RDCS Referring Phys:  [id number] ALEXANDRA DEZII Diagnosing Phys: Keller Alluri IMPRESSIONS  1. Left ventricular ejection fraction, by estimation, is 60 to 65%. The left ventricle has normal function. The left ventricle has no regional wall motion abnormalities. Left ventricular diastolic parameters were normal.  2. Right ventricular systolic function is normal. The right ventricular size is mildly enlarged.  3. The mitral valve is normal in structure. No evidence of mitral valve regurgitation.  4. The aortic valve is tricuspid. Aortic valve regurgitation is not visualized.  5. The inferior vena cava is normal in size with greater than 50% respiratory variability, suggesting right atrial pressure of 3 mmHg.  FINDINGS  Left Ventricle: Left ventricular ejection fraction, by estimation, is 60 to 65%. The left ventricle has normal function. The left ventricle has no regional wall motion abnormalities. The left ventricular internal cavity size was normal in size. There is  no left ventricular hypertrophy. Left ventricular diastolic parameters were normal. Right Ventricle: The right ventricular size is mildly enlarged. No increase in right ventricular wall thickness. Right ventricular systolic function is normal. Left Atrium: Left atrial size was normal in size. Right Atrium: Right atrial size was normal in size. Pericardium: There is no evidence of pericardial effusion. Mitral Valve: The mitral valve is normal in structure. No evidence of mitral valve regurgitation. Tricuspid Valve: The tricuspid valve is normal in structure. Tricuspid valve regurgitation is trivial. Aortic Valve: The aortic valve is tricuspid. Aortic valve regurgitation is not visualized. Pulmonic Valve: The pulmonic valve was not well visualized. Pulmonic valve regurgitation is not visualized. Aorta: The aortic root and ascending aorta are structurally normal, with no evidence of dilitation. Venous: The inferior vena cava is normal in size with greater than 50% respiratory variability, suggesting right atrial pressure of 3 mmHg. IAS/Shunts: The atrial septum is grossly normal.  LEFT VENTRICLE PLAX 2D LVIDd:         5.80 cm   Diastology LVIDs:         3.10 cm   LV e' medial:    13.13 cm/s LV PW:         0.80 cm   LV E/e' medial:  8.3 LV IVS:        0.60 cm   LV e' lateral:   13.83 cm/s LVOT diam:     2.20 cm   LV E/e' lateral: 7.9 LV SV:         93 LV SV Index:   38 LVOT Area:     3.80 cm  RIGHT VENTRICLE             IVC RV Basal diam:  4.50 cm  IVC diam: 1.60 cm RV S prime:     18.47 cm/s TAPSE (M-mode): 3.0 cm LEFT ATRIUM             Index        RIGHT ATRIUM           Index LA diam:        4.10 cm 1.67 cm/m   RA Area:     17.50 cm LA Vol (A2C):    59.3 ml 24.11 ml/m  RA Volume:   57.50 ml  23.38 ml/m LA Vol (A4C):   40.7 ml 16.55 ml/m LA Biplane Vol: 52.3 ml 21.26 ml/m  AORTIC VALVE LVOT Vmax:   136.33 cm/s LVOT Vmean:  87.633 cm/s LVOT VTI:    0.245 m  AORTA Ao Root diam: 3.80 cm Ao Asc diam:  3.60 cm MITRAL VALVE MV Area (PHT): 4.28 cm     SHUNTS MV Decel Time: 177 msec     Systemic VTI:  0.24 m MV E velocity: 109.20 cm/s  Systemic Diam: 2.20 cm MV A velocity: 67.07 cm/s MV E/A ratio:  1.63 Keller Paterson Electronically signed by Keller Paterson Signature Date/Time: 02/16/2024/11:39:26 AM    Final    DG Chest 2 View Result Date: 02/14/2024 CLINICAL DATA:  Shortness of breath EXAM: DG CHEST 2V COMPARISON:  05/07/2022 FINDINGS: Frontal and lateral views of the chest demonstrate an unremarkable cardiac silhouette. No acute airspace disease, effusion, or pneumothorax. No acute bony abnormalities. IMPRESSION: 1. No acute intrathoracic process. Electronically Signed   By: Ozell Daring M.D.   On: 02/14/2024 14:57      Labs: BNP (last 3 results) No results for input(s): BNP in the last 8760 hours. Basic Metabolic Panel: Recent Labs  Lab 02/14/24 1332 02/14/24 1830 02/15/24 0543  NA 137  --  136  K 3.8  --  4.0  CL 94*  --  97*  CO2 27  --  29  GLUCOSE 118*  --  212*  BUN 10  --  14  CREATININE 0.83  --  0.69  CALCIUM 9.0  --  9.0  MG  --  2.7*  --    Liver Function Tests: No results for input(s): AST, ALT, ALKPHOS, BILITOT, PROT, ALBUMIN in the last 168 hours. No results for input(s): LIPASE, AMYLASE in the last 168 hours. No results for input(s): AMMONIA in the last 168 hours. CBC: Recent Labs  Lab 02/14/24 1332 02/15/24 0543  WBC 8.7 9.8  HGB 17.2* 16.8  HCT 52.9* 52.1*  MCV 97.4 96.7  PLT 231 237   Cardiac Enzymes: No results for input(s): CKTOTAL, CKMB, CKMBINDEX, TROPONINI in the last 168 hours. BNP: Invalid input(s): POCBNP CBG: Recent Labs  Lab 02/16/24 0830  GLUCAP 132*    D-Dimer No results for input(s): DDIMER in the last 72 hours. Hgb A1c Recent Labs    02/15/24 1549  HGBA1C 6.4*   Lipid Profile No results for input(s): CHOL, HDL, LDLCALC, TRIG, CHOLHDL, LDLDIRECT in the last 72 hours. Thyroid function studies No results for input(s): TSH, T4TOTAL, T3FREE, THYROIDAB in the last 72 hours.  Invalid input(s): FREET3 Anemia work up No results for input(s): VITAMINB12, FOLATE, FERRITIN, TIBC, IRON, RETICCTPCT in the last 72 hours. Urinalysis    Component Value Date/Time   COLORURINE YELLOW (A) 06/24/2018 1425   APPEARANCEUR TURBID (A) 06/24/2018 1425   LABSPEC 1.028 06/24/2018 1425   PHURINE 5.0 06/24/2018 1425   GLUCOSEU NEGATIVE 06/24/2018 1425   HGBUR NEGATIVE 06/24/2018 1425  BILIRUBINUR NEGATIVE 06/24/2018 1425   KETONESUR NEGATIVE 06/24/2018 1425   PROTEINUR NEGATIVE 06/24/2018 1425   NITRITE NEGATIVE 06/24/2018 1425   LEUKOCYTESUR NEGATIVE 06/24/2018 1425   Sepsis Labs Recent Labs  Lab 02/14/24 1332 02/15/24 0543  WBC 8.7 9.8   Microbiology Recent Results (from the past 240 hours)  Resp panel by RT-PCR (RSV, Flu A&B, Covid) Anterior Nasal Swab     Status: None   Collection Time: 02/14/24  3:32 PM   Specimen: Anterior Nasal Swab  Result Value Ref Range Status   SARS Coronavirus 2 by RT PCR NEGATIVE NEGATIVE Final    Comment: (NOTE) SARS-CoV-2 target nucleic acids are NOT DETECTED.  The SARS-CoV-2 RNA is generally detectable in upper respiratory specimens during the acute phase of infection. The lowest concentration of SARS-CoV-2 viral copies this assay can detect is 138 copies/mL. A negative result does not preclude SARS-Cov-2 infection and should not be used as the sole basis for treatment or other patient management decisions. A negative result may occur with  improper specimen collection/handling, submission of specimen other than nasopharyngeal swab, presence of viral mutation(s)  within the areas targeted by this assay, and inadequate number of viral copies(<138 copies/mL). A negative result must be combined with clinical observations, patient history, and epidemiological information. The expected result is Negative.  Fact Sheet for Patients:  bloggercourse.com  Fact Sheet for Healthcare Providers:  seriousbroker.it  This test is no t yet approved or cleared by the [place]  FDA and  has been authorized for detection and/or diagnosis of SARS-CoV-2 by FDA under an Emergency Use Authorization (EUA). This EUA will remain  in effect (meaning this test can be used) for the duration of the COVID-19 declaration under Section 564(b)(1) of the Act, 21 U.S.C.section 360bbb-3(b)(1), unless the authorization is terminated  or revoked sooner.       Influenza A by PCR NEGATIVE NEGATIVE Final   Influenza B by PCR NEGATIVE NEGATIVE Final    Comment: (NOTE) The Xpert Xpress SARS-CoV-2/FLU/RSV plus assay is intended as an aid in the diagnosis of influenza from Nasopharyngeal swab specimens and should not be used as a sole basis for treatment. Nasal washings and aspirates are unacceptable for Xpert Xpress SARS-CoV-2/FLU/RSV testing.  Fact Sheet for Patients: bloggercourse.com  Fact Sheet for Healthcare Providers: seriousbroker.it  This test is not yet approved or cleared by the [place]  FDA and has been authorized for detection and/or diagnosis of SARS-CoV-2 by FDA under an Emergency Use Authorization (EUA). This EUA will remain in effect (meaning this test can be used) for the duration of the COVID-19 declaration under Section 564(b)(1) of the Act, 21 U.S.C. section 360bbb-3(b)(1), unless the authorization is terminated or revoked.     Resp Syncytial Virus by PCR NEGATIVE NEGATIVE Final    Comment: (NOTE) Fact Sheet for  Patients: bloggercourse.com  Fact Sheet for Healthcare Providers: seriousbroker.it  This test is not yet approved or cleared by the [place]  FDA and has been authorized for detection and/or diagnosis of SARS-CoV-2 by FDA under an Emergency Use Authorization (EUA). This EUA will remain in effect (meaning this test can be used) for the duration of the COVID-19 declaration under Section 564(b)(1) of the Act, 21 U.S.C. section 360bbb-3(b)(1), unless the authorization is terminated or revoked.  Performed at [facility], [address] [place], [place] [address]   Respiratory (~20 pathogens) panel by PCR     Status: Abnormal   Collection Time: 02/16/24 10:30 AM   Specimen: Nasopharyngeal Swab; Respiratory  Result Value Ref Range Status   Adenovirus NOT DETECTED NOT DETECTED Final   Coronavirus 229E NOT DETECTED NOT DETECTED Final    Comment: (NOTE) The Coronavirus on the Respiratory Panel, DOES NOT test for the novel  Coronavirus (2019 nCoV)    Coronavirus HKU1 NOT DETECTED NOT DETECTED Final   Coronavirus NL63 NOT DETECTED NOT DETECTED Final   Coronavirus OC43 NOT DETECTED NOT DETECTED Final   Metapneumovirus NOT DETECTED NOT DETECTED Final   Rhinovirus / Enterovirus DETECTED (A) NOT DETECTED Final   Influenza A NOT DETECTED NOT DETECTED Final   Influenza B NOT DETECTED NOT DETECTED Final   Parainfluenza Virus 1 NOT DETECTED NOT DETECTED Final   Parainfluenza Virus 2 NOT DETECTED NOT DETECTED Final   Parainfluenza Virus 3 NOT DETECTED NOT DETECTED Final   Parainfluenza Virus 4 NOT DETECTED NOT DETECTED Final   Respiratory Syncytial Virus NOT DETECTED NOT DETECTED Final   Bordetella pertussis NOT DETECTED NOT DETECTED Final   Bordetella Parapertussis NOT DETECTED NOT DETECTED Final   Chlamydophila pneumoniae NOT DETECTED NOT DETECTED Final   Mycoplasma pneumoniae NOT DETECTED NOT DETECTED Final    Comment:  Performed at [facility], [address] [place], [place] [address]     Total time spend on discharging this patient, including the last patient exam, discussing the hospital stay, instructions for ongoing care as it relates to all pertinent caregivers, as well as preparing the medical discharge records, prescriptions, and/or referrals as applicable, is 40 minutes.    Ellouise Haber, MD  [facility] 02/17/2024, 10:47 AM

## 2024-02-23 NOTE — Progress Notes (Signed)
 Chief Complaint  Patient presents with  . Hospital Follow Up    HPI  History of Present Illness Tony Gibson is a 53 year old male who presents for follow-up after a recent hospitalization for presumed COPD exacerbation.  Dyspnea and chest discomfort - Significant shortness of breath and uncomfortable chest pain with exertion, such as walking up stairs - Symptoms have persisted for months and were exacerbated by a recent cold (rhinovirus positive) - No chest pain, shortness of breath, or palpitations at rest  Recent hospitalization for copd exacerbation - Hospitalized from October 20 to February 17, 2024, for COPD exacerbation at [facility] - No prior history of COPD - Treated with doxycycline, Rocephin , and prednisone  during hospitalization - Chest x-ray negative for pneumonia - Completed a course of prednisone , which improved symptoms  Inhaler use and medication access - Using an outdated Advair inhaler, two puffs in the morning and evening - Did not fill new Advair prescription due to cost - Ran out of albuterol  inhaler, previously used as needed for shortness of breath  Allergic rhinitis and sinus symptoms - Manages allergies and sinus issues with Flonase - Considering use of over-the-counter antihistamine such as Claritin  Smoking history - Long-term tobacco use, attributed to severity of respiratory symptoms; started using patches for cessation   ROS Review of systems is unremarkable for any active cardiac, respiratory, GI, GU, hematologic, neurologic, dermatologic, HEENT, or psychiatric symptoms except as noted above.  No fevers, chills, or constitutional symptoms.   Current Outpatient Medications  Medication Sig Dispense Refill  . amLODIPine  (NORVASC ) 10 MG tablet TAKE 1 TABLET BY MOUTH ONCE  DAILY 90 tablet 3  . aspirin  81 MG EC tablet Take 81 mg by mouth once daily.    . blood glucose diagnostic (ONETOUCH VERIO TEST STRIPS) test strip USE  TO CHECK FASTING BLOOD SUGAR ONCE DAILY 100 strip 1  . blood glucose meter kit Check blood sugar once daily fasting. Dx E11.9 ONE TOUCH 1 each 0  . busPIRone  (BUSPAR ) 15 MG tablet TAKE 1 TABLET(15 MG) BY MOUTH TWICE DAILY 200 tablet 1  . carvediloL  (COREG ) 12.5 MG tablet Take 1 tablet (12.5 mg total) by mouth 2 (two) times daily with meals 200 tablet 1  . fluticasone  propion-salmeteroL (ADVAIR DISKUS) 250-50 mcg/dose diskus inhaler Inhale 1 Puff into the lungs every 12 (twelve) hours    . folic acid (FOLVITE) 1 MG tablet Take 1 mg by mouth once daily    . hydroCHLOROthiazide  (HYDRODIURIL ) 25 MG tablet TAKE 1 TABLET BY MOUTH ONCE  DAILY 90 tablet 3  . lancets Check blood sugar once daily fasting. Dx E11.9 ONE TOUCH 100 each 1  . lovastatin (MEVACOR) 10 MG tablet TAKE 1 TABLET BY MOUTH DAILY  WITH DINNER 90 tablet 3  . metFORMIN (GLUCOPHAGE-XR) 500 MG XR tablet TAKE 1 TABLET(500 MG) BY MOUTH DAILY WITH DINNER 100 tablet 1  . PARoxetine  (PAXIL ) 20 MG tablet TAKE 1 TABLET BY MOUTH AT NIGHT ALONG WITH 30MG  TABLET OF PAXIL  TO EQUAL 50MG  90 tablet 1  . PARoxetine  (PAXIL ) 30 MG tablet TAKE 1 TABLET BY MOUTH NIGHTLY ALONG WITH 20 MG PAXIL  TABLET 90 tablet 1  . sildenafiL (VIAGRA) 50 MG tablet 1/2 to 1 tab daily 1-2 hours prior to intercourse prn for ED 10 tablet 1  . thiamine (VITAMIN B-1) 100 MG tablet Take 100 mg by mouth once daily    . albuterol  MDI, PROVENTIL , VENTOLIN , PROAIR , HFA (VENTOLIN  HFA) 90 mcg/actuation inhaler Inhale 2  inhalations into the lungs every 4 (four) hours as needed for Wheezing or Shortness of Breath 1 each 1   No current facility-administered medications for this visit.    Allergies as of 02/23/2024 - Reviewed 02/23/2024  Allergen Reaction Noted  . Azithromycin Swelling 10/09/2013  . Enalapril Swelling 10/09/2013  . Losartan  potassium Swelling 11/01/2018    Patient Active Problem List  Diagnosis  . GAD (generalized anxiety disorder)  . Tobacco dependence (1 ppd)  .  Recurrent major depressive disorder, in remission ()  . Essential hypertension with goal blood pressure less than 140/90  . Class 2 severe obesity due to excess calories with serious comorbidity and body mass index (BMI) of 37.0 to 37.9 in adult  . Aortic atherosclerosis (CXR 09/01/16; LDL 69 - 10/01/23)  . Lumbar radiculopathy, chronic - followed by Dr. Dodson  . Diabetes mellitus with coincident hypertension (A1c 7.2% - 10/01/23)  . History of angioedema (10/2018)   . Hypertriglyceridemia (Trig 228, LDL 69 - 10/01/23)  . 10 year risk of MI or stroke 7.5% or greater  . Fatty liver (Cardiac CT 09/24/22)  . Other male erectile dysfunction    Past Medical History:  Diagnosis Date  . Anxiety   . Depression   . History of alcohol dependence (CMS/HHS-HCC)   . HTN (hypertension)   . Obesity (BMI 30.0-34.9)   . Tobacco dependence     Past Surgical History:  Procedure Laterality Date  . Left L4-5 Left L5-S1 hemilaminectomy & discectomy  06/27/2018   Dr Elspeth Ahle at [facility]  . COLONOSCOPY  06/25/2020   Tubular adenoma/Repeat 74yr/CTL  . COLONOSCOPY  01/30/2022   Tubular adenoma/PHx CP/Repeat 59yrs/CTL  . APPENDECTOMY      Vitals:   02/23/24 1134  BP: (!) 144/86  Pulse: 81  SpO2: 97%  Weight: (!) 125 kg (275 lb 9.6 oz)  Height: 185.4 cm (6' 1)  PainSc: 0-No pain   Body mass index is 36.36 kg/m.  Exam  General. Well appearing male companied by family; NAD; VS reviewed     Eyes. Sclera and conjunctiva clear; Vision grossly intact; extraocular movements intact Neck. Supple.   Lungs. Respirations unlabored; positive end expiratory wheezing throughout; no rhonchi Cardiovascular. Heart regular rate and rhythm without murmurs, gallops, or rubs Skin. Normal color and turgor Neurologic. Alert and oriented x3; CN 2-12 grossly intact; no focal deficits  Assessment and Plan  Assessment & Plan History of respiratory distress with possible/reactive airway disease under evaluation Recent  hospitalization for presumed COPD exacerbation without previous history of COPD. Differential diagnosis includes COPD, asthma, emphysema, or other reactive airway disease. Symptoms include shortness of breath and wheezing, exacerbated by a recent rhinovirus infection. Smoking history is a significant risk factor. - Order pulmonary function test to evaluate lung capacity and confirm diagnosis. - Prescribe albuterol  inhaler for shortness of breath, instruct to use two puffs as needed. - Advise to use Claritin or other second-generation antihistamine and Flonase for allergy symptoms. - Instruct to avoid using outdated Advair inhaler until diagnosis is confirmed.  Tobacco dependence Smoking history is a significant risk factor for COPD and other health issues. Smoking cessation is crucial to prevent further exacerbations and improve overall health. - Strongly advise smoking cessation.  Essential hypertension Hypertension is a known risk factor for cardiovascular disease. Current management includes medication and lifestyle modifications.  Type 2 diabetes mellitus with hypertension Diabetes with coincident hypertension is being managed with current medications and lifestyle modifications.  Hypertriglyceridemia Hypertriglyceridemia is being managed with current  medications and lifestyle modifications.  Class 2 severe obesity (BMI 37.0-37.9) with comorbidity Obesity is a contributing factor to multiple comorbidities including hypertension and diabetes. Weight management is essential for overall health improvement. - Encourage healthy eating and regular exercise to manage weight and improve cardiovascular health.  Medications and allergies reviewed and reconciled.  Hospital notes, labs, and studies reviewed.  Transitional phone call complete within 48 hours of discharge.  Follow-up as scheduled.  Pulmonary function test pending  ALDA CARPEN, MD
